# Patient Record
Sex: Female | Born: 1995 | Race: White | Hispanic: No | Marital: Single | Smoking: Never smoker
Health system: Southern US, Community
[De-identification: ages and names within clinical notes are randomized; demographics above are authoritative.]

## PROBLEM LIST (undated history)

## (undated) DIAGNOSIS — F32A Depression, unspecified: Secondary | ICD-10-CM

## (undated) DIAGNOSIS — F329 Major depressive disorder, single episode, unspecified: Secondary | ICD-10-CM

## (undated) HISTORY — DX: Major depressive disorder, single episode, unspecified: F32.9

## (undated) HISTORY — PX: WISDOM TOOTH EXTRACTION: SHX21

## (undated) HISTORY — DX: Depression, unspecified: F32.A

## (undated) HISTORY — PX: ADENOIDECTOMY: SHX5191

---
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
## 2014-01-31 ENCOUNTER — Telehealth: Payer: Self-pay | Admitting: Family Medicine

## 2014-01-31 NOTE — Telephone Encounter (Signed)
Patient needs a copy of her shot record and a copy of her tb test.  She would like to pick it up tomorrow afternoon.  She will sign a release when she comes in.

## 2014-01-31 NOTE — Telephone Encounter (Signed)
Spoke with mom, who informed me that she needs a TB test for a job she is trying to get.   Advised that her last TB test was in 2013 and they are only valid for one year and that she will need a new one.  Appt scheduled for Friday. [place], Maryjo RochesterJessica Dawn

## 2014-02-01 ENCOUNTER — Ambulatory Visit (INDEPENDENT_AMBULATORY_CARE_PROVIDER_SITE_OTHER): Payer: Medicaid Other | Admitting: *Deleted

## 2014-02-01 DIAGNOSIS — Z111 Encounter for screening for respiratory tuberculosis: Secondary | ICD-10-CM

## 2014-02-01 DIAGNOSIS — Z23 Encounter for immunization: Secondary | ICD-10-CM

## 2014-02-04 ENCOUNTER — Ambulatory Visit (INDEPENDENT_AMBULATORY_CARE_PROVIDER_SITE_OTHER): Payer: No Typology Code available for payment source | Admitting: *Deleted

## 2014-02-04 ENCOUNTER — Encounter: Payer: Self-pay | Admitting: *Deleted

## 2014-02-04 DIAGNOSIS — Z111 Encounter for screening for respiratory tuberculosis: Secondary | ICD-10-CM

## 2014-02-04 LAB — TB SKIN TEST
INDURATION: 0 mm
TB Skin Test: NEGATIVE

## 2014-02-11 ENCOUNTER — Other Ambulatory Visit: Payer: Self-pay | Admitting: *Deleted

## 2014-02-11 MED ORDER — NORGESTIMATE-ETH ESTRADIOL 0.25-35 MG-MCG PO TABS: 1.0000 | ORAL_TABLET | Freq: Every day | ORAL | Status: DC

## 2014-03-06 ENCOUNTER — Encounter: Payer: Self-pay | Admitting: Family Medicine

## 2014-03-06 ENCOUNTER — Ambulatory Visit (INDEPENDENT_AMBULATORY_CARE_PROVIDER_SITE_OTHER): Payer: Medicaid Other | Admitting: Family Medicine

## 2014-03-06 VITALS — BP 117/69 | HR 84 | Temp 98.3°F | Ht 65.0 in | Wt 154.0 lb

## 2014-03-06 DIAGNOSIS — Z0000 Encounter for general adult medical examination without abnormal findings: Secondary | ICD-10-CM

## 2014-03-06 DIAGNOSIS — Z309 Encounter for contraceptive management, unspecified: Secondary | ICD-10-CM

## 2014-03-06 MED ORDER — NORGESTIMATE-ETH ESTRADIOL 0.25-35 MG-MCG PO TABS: 1.0000 | ORAL_TABLET | Freq: Every day | ORAL | Status: DC

## 2014-03-06 NOTE — Patient Instructions (Signed)
Sorry for your wait. Glad the birth control is working well for you. See you in a year if needed, otherwise just as needed.

## 2014-03-06 NOTE — Progress Notes (Signed)
Subjective:    Sandra Hodges is a 18 y.o. female who presents for contraception counseling. The patient has no complaints today. The patient is sexually active. She is having monthly periods which are not particularly painful or heavy.   Pertinent past medical history: none, non smoker. History of breakthrough bleeding on nexplanon  Menstrual History: G0P0   Review of Systems Denies nausea/vomiting/fever/chills/fatigue/overall sick feelings. No excessive bleeding. No lightheadedness or dizziness Objective:  BP 117/69  Pulse 84  Temp(Src) 98.3 F (36.8 C) (Oral)  Ht 5\' 5"  (1.651 m)  Wt 154 lb (69.854 kg)  BMI 25.63 kg/m2 Well appearing, no acute distress  Assessment:    18 y.o., continuing OCP (estrogen/progesterone), no contraindications.   Plan:    All questions answered. Discussed healthy lifestyle modifications. pap smear at age 18   Health Maintenance Due  Topic Date Due  . Tetanus/tdap -received for nursing school, will try to figure out when/where and let us know at next visit 01/05/2014

## 2014-03-08 NOTE — Progress Notes (Signed)
Yes I can change this.  What would be the recommendation for this patient for pap smears.  The default HM is female 4318 years and older every 3 years.

## 2014-03-22 ENCOUNTER — Ambulatory Visit (INDEPENDENT_AMBULATORY_CARE_PROVIDER_SITE_OTHER): Payer: Medicaid Other | Admitting: Family Medicine

## 2014-03-22 ENCOUNTER — Encounter: Payer: Self-pay | Admitting: Family Medicine

## 2014-03-22 VITALS — BP 102/71 | HR 102 | Temp 98.3°F | Wt 153.0 lb

## 2014-03-22 DIAGNOSIS — J020 Streptococcal pharyngitis: Secondary | ICD-10-CM

## 2014-03-22 DIAGNOSIS — J029 Acute pharyngitis, unspecified: Secondary | ICD-10-CM

## 2014-03-22 LAB — POCT RAPID STREP A (OFFICE): RAPID STREP A SCREEN: NEGATIVE

## 2014-03-22 MED ORDER — PENICILLIN G BENZATHINE 1200000 UNIT/2ML IM SUSP
1.2000 10*6.[IU] | Freq: Once | INTRAMUSCULAR | Status: AC
  Administered 2014-03-22: 1.2 10*6.[IU] via INTRAMUSCULAR

## 2014-03-22 NOTE — Patient Instructions (Signed)
You have enough indicators that we will treat even if your first test is negative. I will not have them send out the confirmatory testing as it will not change management. Please come back to see us if your symptoms are persistent into next week.   Strep Throat Strep throat is an infection of the throat caused by a bacteria named Streptococcus pyogenes. Your caregiver may call the infection streptococcal "tonsillitis" or "pharyngitis" depending on whether there are signs of inflammation in the tonsils or back of the throat. Strep throat is most common in children aged 5 15 years during the cold months of the year, but it can occur in people of any age during any season. This infection is spread from person to person (contagious) through coughing, sneezing, or other close contact. SYMPTOMS   Fever or chills.  Painful, swollen, red tonsils or throat.  Pain or difficulty when swallowing.  White or yellow spots on the tonsils or throat.  Swollen, tender lymph nodes or "glands" of the neck or under the jaw.  Red rash all over the body (rare). DIAGNOSIS  Many different infections can cause the same symptoms. A test must be done to confirm the diagnosis so the right treatment can be given. A "rapid strep test" can help your caregiver make the diagnosis in a few minutes. If this test is not available, a light swab of the infected area can be used for a throat culture test. If a throat culture test is done, results are usually available in a day or two. TREATMENT  Strep throat is treated with antibiotic medicine. HOME CARE INSTRUCTIONS   Gargle with 1 tsp of salt in 1 cup of warm water, 3 4 times per day or as needed for comfort.  Family members who also have a sore throat or fever should be tested for strep throat and treated with antibiotics if they have the strep infection.  Make sure everyone in your household washes their hands well.  Do not share food, drinking cups, or personal items that  could cause the infection to spread to others.  You may need to eat a soft food diet until your sore throat gets better.  Drink enough water and fluids to keep your urine clear or pale yellow. This will help prevent dehydration.  Get plenty of rest.  Stay home from school, daycare, or work until you have been on antibiotics for 24 hours.  Only take over-the-counter or prescription medicines for pain, discomfort, or fever as directed by your caregiver.  If antibiotics are prescribed, take them as directed. Finish them even if you start to feel better. SEEK MEDICAL CARE IF:   The glands in your neck continue to enlarge.  You develop a rash, cough, or earache.  You cough up green, yellow-brown, or bloody sputum.  You have pain or discomfort not controlled by medicines.  Your problems seem to be getting worse rather than better. SEEK IMMEDIATE MEDICAL CARE IF:   You develop any new symptoms such as vomiting, severe headache, stiff or painful neck, chest pain, shortness of breath, or trouble swallowing.  You develop severe throat pain, drooling, or changes in your voice.  You develop swelling of the neck, or the skin on the neck becomes red and tender.  You have a fever.  You develop signs of dehydration, such as fatigue, dry mouth, and decreased urination.  You become increasingly sleepy, or you cannot wake up completely. Document Released: 10/29/2000 Document Revised: 10/18/2012 Document Reviewed: 12/31/2010  ExitCare Patient Information 2014 [place], [place].  Still try to get us the results from your last: Health Maintenance Due  Topic Date Due  . Tetanus/tdap  01/05/2014

## 2014-03-22 NOTE — Progress Notes (Signed)
  Sandra ConchStephen Hunter, MD Phone: [phone]  Subjective:   Sandra Hodges is a 18 y.o. year old very pleasant female patient who presents with the following:  Sore throat X 3 days. Stable in course. No cough. Enlarged tonsils that had some exudate on them previously. Subjective fevers. Known strep contacts. Has tried tylenol, dayquil and nyquil with only mild relief. Slightly decreased PO ROS- no nausea/vomiting. Mild sinus drainage. No headaches or blurry vision. No rash   Past Medical History- non smoker Social history-just finished semester at [employer], working towards being a Engineer, civil (consulting)nurse  Medications- reviewed and updated Current Outpatient Prescriptions  Medication Sig Dispense Refill  . norgestimate-ethinyl estradiol (ORTHO-CYCLEN,SPRINTEC,PREVIFEM) 0.25-35 MG-MCG tablet Take 1 tablet by mouth daily.  1 Package  11   No current facility-administered medications for this visit.    Objective: BP 102/71  Pulse 102  Temp(Src) 98.3 F (36.8 C) (Oral)  Wt 153 lb (69.4 kg)  LMP 03/11/2014 Gen: NAD, resting comfortably on table HEENT: tender lymphadenopathy noted, enlarged erythematous tonsils without exudate, pharynx with moderate erythema, no obvious rhinorrhea and nares normal, PERRLA CV: RRR no murmurs rubs or gallops Lungs: CTAB no crackles, wheeze, rhonchi Skin: warm, dry, no rash  Assessment/Plan:  Strep throat 4/5 Centor Criteria (no cough, enlarged tonsils, tender lymph nodes, fever) with known positive strep contact. Rapid strep is negative but will empirically treat with Bicillin so did not send confirmatory testing. F/u if no improvement next week.   Orders Placed This Encounter  Procedures  . Rapid Strep A -negative   Meds ordered this encounter  Medications  . penicillin g benzathine (BICILLIN LA) 1200000 UNIT/2ML injection 1.2 Million Units    Sig:     Order Specific Question:  Antibiotic Indication:    Answer:  Pharyngitis

## 2014-08-16 ENCOUNTER — Encounter: Payer: Self-pay | Admitting: Family Medicine

## 2014-08-16 ENCOUNTER — Ambulatory Visit (INDEPENDENT_AMBULATORY_CARE_PROVIDER_SITE_OTHER): Payer: BC Managed Care – PPO | Admitting: Family Medicine

## 2014-08-16 VITALS — BP 119/86 | HR 94 | Temp 98.6°F | Wt 155.0 lb

## 2014-08-16 DIAGNOSIS — Z23 Encounter for immunization: Secondary | ICD-10-CM | POA: Diagnosis not present

## 2014-08-16 DIAGNOSIS — J029 Acute pharyngitis, unspecified: Secondary | ICD-10-CM

## 2014-08-16 LAB — POCT MONO (EPSTEIN BARR VIRUS): Mono, POC: NEGATIVE

## 2014-08-16 LAB — POCT RAPID STREP A (OFFICE): Rapid Strep A Screen: NEGATIVE

## 2014-08-16 NOTE — Patient Instructions (Signed)
You have viral infection that will resolve on its own over time.  Typically, symptoms can last for up to 10 days (or more) but should be continually improving after the 5th day.  If you exerpience worsening after the 7th day please call us back.  Please continue to drink plenty of fluids and remember you and everybody in your household need to wash their hands frequently!  Honey has been shown to help with cough and soothe the throat. You can try mixing  a teaspoon of honey in warm water or caffeine free herbal tea before bedtime. Gargling with salt water can also help.  We don't know why, but chicken soup also helps, try it!  I will call in antibiotics if your throat culture is positive

## 2014-08-17 LAB — STREP A DNA PROBE: GASP: NEGATIVE

## 2014-08-27 ENCOUNTER — Ambulatory Visit (INDEPENDENT_AMBULATORY_CARE_PROVIDER_SITE_OTHER): Payer: BC Managed Care – PPO | Admitting: Family Medicine

## 2014-08-27 ENCOUNTER — Encounter: Payer: Self-pay | Admitting: Family Medicine

## 2014-08-27 VITALS — BP 103/72 | HR 103 | Temp 98.5°F | Wt 155.0 lb

## 2014-08-27 DIAGNOSIS — J019 Acute sinusitis, unspecified: Secondary | ICD-10-CM | POA: Insufficient documentation

## 2014-08-27 DIAGNOSIS — J029 Acute pharyngitis, unspecified: Secondary | ICD-10-CM | POA: Diagnosis not present

## 2014-08-27 LAB — POCT RAPID STREP A (OFFICE): Rapid Strep A Screen: NEGATIVE

## 2014-08-27 MED ORDER — AMOXICILLIN-POT CLAVULANATE 875-125 MG PO TABS: 1.0000 | ORAL_TABLET | Freq: Two times a day (BID) | ORAL | Status: DC

## 2014-08-27 NOTE — Progress Notes (Signed)
   Subjective:    Patient ID: Sandra Hodges, female    DOB: 1996-06-09, 18 y.o.   MRN: [id number]  Patient presents for a same day appointment.  HPI  SINUS CONGESTION / SORE THROAT: - [facility] for same complaint, suspected viral pharyngitis, testing with GSA swab negative, and Monospot negative - Reports feeling significant sinus congestion and sinus pressure, feels like throat is swollen and sore with swallowing, additionally sinus pressure with fluid in both ears. Overall symptoms have been going on for about 2.5 weeks with some improvement, but never completely resolved, and then now seems to be worsening over past 1 week. Last visit 08/16/14 with suspected viral pharyngitis did not receive any antibiotics. Also history of recent strep throat (x 2 within the past year, 01/2014 and 03/2014), resolved after PCN injection at clinic - Additionally states boyfriend with similar symptoms (+sick contact), recently tested positive for strep and treated for sinus infection - Taking Mucinex, DayQuil / NyQuil (not regularly), occasional without significant relief - Admits to feeling "hot flashes and cold chills with some sweating", HAs, sinus pressure, nasal congestion - Denies significant cough (improved with Mucinex), abdominal pain, nausea / vomiting, ear pain  HM: - Received influenza vaccine 10/2  I have reviewed and updated the following as appropriate: allergies and current medications  Social Hx: - Never smoker  Review of Systems  See above HPI    Objective:   Physical Exam  BP 103/72  Pulse 103  Temp(Src) 98.5 F (36.9 C) (Oral)  Wt 155 lb (70.308 kg)  LMP 08/09/2014  Gen - ill-appearing but non-toxic, comfortable and cooperative, NAD HEENT - NCAT without significant sinus tenderness, PERRL, EOMI, mildly edematous turbinates with thick congestion, oropharynx bilateral tonsillar erythema and edema with some exudates appears symmetrical, TM's bilateral with  mild clear effusion, no erythema or significant bulging, MMM Neck - supple, positive tender anterior LAD (R>L) Heart - mildly tachycardic, regular rhythm, no murmurs Lungs - CTAB, no wheezing, crackles, or rhonchi. Normal work of breathing. Skin - warm, dry, no rashes Neuro - awake, alert     Assessment & Plan:   See specific A&P problem list for details.

## 2014-08-27 NOTE — Assessment & Plan Note (Signed)
Clinically consistent with Strep Pharyngitis despite negative rapid strep (08/27/14) with Centor score 3-4 (+tonsillar exudates, anterior cervical tender LAD, subjective fever, and absence of cough), also concern with prior strep pharyngitis infection, and current known contact with strep positive - Last OV 08/16/14 with GSA DNA probe negative and Monospot negative (considered mono, less likely with negative screen)  Plan: 1. Empiric coverage for strep pharyngitis with Augmentin for sinusitis 2. F/u if no improvement

## 2014-08-27 NOTE — Patient Instructions (Signed)
Dear Sandra Hodges, Thank you for coming in to clinic today  1. I think that you have a Sinus Infection and Strep throat - we will treat with Augmentin take 1 tablet twice daily (every 12 hours) for 10 days (continue to take even if you feel better) 2. Rapid strep negative today, and mono negative at last visit  Please schedule a follow-up appointment with Dr. Jimmey RalphParker in 7 to 10 days if symptoms not resolving or sooner if worsening.  If you have any other questions or concerns, please feel free to call the clinic to contact me. You may also schedule an earlier appointment if necessary.  However, if your symptoms get significantly worse, please go to the Emergency Department to seek immediate medical attention.  Saralyn PilarAlexander Karamalegos, DO [facility]   Sinusitis Sinusitis is redness, soreness, and inflammation of the paranasal sinuses. Paranasal sinuses are air pockets within the bones of your face (beneath the eyes, the middle of the forehead, or above the eyes). In healthy paranasal sinuses, mucus is able to drain out, and air is able to circulate through them by way of your nose. However, when your paranasal sinuses are inflamed, mucus and air can become trapped. This can allow bacteria and other germs to grow and cause infection. Sinusitis can develop quickly and last only a short time (acute) or continue over a long period (chronic). Sinusitis that lasts for more than 12 weeks is considered chronic.  CAUSES  Causes of sinusitis include:  Allergies.  Structural abnormalities, such as displacement of the cartilage that separates your nostrils (deviated septum), which can decrease the air flow through your nose and sinuses and affect sinus drainage.  Functional abnormalities, such as when the small hairs (cilia) that line your sinuses and help remove mucus do not work properly or are not present. SIGNS AND SYMPTOMS  Symptoms of acute and chronic sinusitis are the same. The primary  symptoms are pain and pressure around the affected sinuses. Other symptoms include:  Upper toothache.  Earache.  Headache.  Bad breath.  Decreased sense of smell and taste.  A cough, which worsens when you are lying flat.  Fatigue.  Fever.  Thick drainage from your nose, which often is green and may contain pus (purulent).  Swelling and warmth over the affected sinuses. DIAGNOSIS  Your health care provider will perform a physical exam. During the exam, your health care provider may:  Look in your nose for signs of abnormal growths in your nostrils (nasal polyps).  Tap over the affected sinus to check for signs of infection.  View the inside of your sinuses (endoscopy) using an imaging device that has a light attached (endoscope). If your health care provider suspects that you have chronic sinusitis, one or more of the following tests may be recommended:  Allergy tests.  Nasal culture. A sample of mucus is taken from your nose, sent to a lab, and screened for bacteria.  Nasal cytology. A sample of mucus is taken from your nose and examined by your health care provider to determine if your sinusitis is related to an allergy. TREATMENT  Most cases of acute sinusitis are related to a viral infection and will resolve on their own within 10 days. Sometimes medicines are prescribed to help relieve symptoms (pain medicine, decongestants, nasal steroid sprays, or saline sprays).  However, for sinusitis related to a bacterial infection, your health care provider will prescribe antibiotic medicines. These are medicines that will help kill the bacteria causing the  infection.  Rarely, sinusitis is caused by a fungal infection. In theses cases, your health care provider will prescribe antifungal medicine. For some cases of chronic sinusitis, surgery is needed. Generally, these are cases in which sinusitis recurs more than 3 times per year, despite other treatments. HOME CARE INSTRUCTIONS     Drink plenty of water. Water helps thin the mucus so your sinuses can drain more easily.  Use a humidifier.  Inhale steam 3 to 4 times a day (for example, sit in the bathroom with the shower running).  Apply a warm, moist washcloth to your face 3 to 4 times a day, or as directed by your health care provider.  Use saline nasal sprays to help moisten and clean your sinuses.  Take medicines only as directed by your health care provider.  If you were prescribed either an antibiotic or antifungal medicine, finish it all even if you start to feel better. SEEK IMMEDIATE MEDICAL CARE IF:  You have increasing pain or severe headaches.  You have nausea, vomiting, or drowsiness.  You have swelling around your face.  You have vision problems.  You have a stiff neck.  You have difficulty breathing. MAKE SURE YOU:   Understand these instructions.  Will watch your condition.  Will get help right away if you are not doing well or get worse. Document Released: 11/01/2005 Document Revised: 03/18/2014 Document Reviewed: 11/16/2011 [facility] Patient Information 2015 [place], [place]. This information is not intended to replace advice given to you by your health care provider. Make sure you discuss any questions you have with your health care provider.

## 2014-08-27 NOTE — Assessment & Plan Note (Signed)
Clinically consistent with acute sinusitis, suspect bacterial etiology in setting of worsening URI symptoms, persistent >2 weeks, likely started with viral URI/pharyngitis, known sick contacts. - Afebrile currently, ill but well-appearing, no focal sinus findings or evidence of AoM - Additionally with suspected strep pharyngitis  Plan: 1. Augmentin 875-125 PO BID x 10 days, empiric treatment for sinusitis and coverage for possible strep throat 2. Continue conservative therapy PRN 3. RTC 7-10 days if no improvement / worsening

## 2014-09-01 NOTE — Assessment & Plan Note (Signed)
Negative rapid strep, confirmatory test sent, treated multiple times despite negative tests, likely viral - defer abx at this time, symptoms management discussed - RTC if not improved in 1 week

## 2014-09-01 NOTE — Progress Notes (Signed)
   Subjective:    Patient ID: Sandra Hodges, female    DOB: 16-Oct-1996, 18 y.o.   MRN: [id number]  Sore Throat    Pt presents for sore throat. No cough, congestion. No voice changes, subjective fevers. Headaches and muscle aches. Reports boyfriend has strep throat and she has had another strep infection last month. On record review she tested negative at that time but got empiric antibiotics.    Review of Systems See HPI    Objective:   Physical Exam  Nursing note and vitals reviewed. Constitutional: She is oriented to person, place, and time. She appears well-developed and well-nourished. No distress.  HENT:  Head: Normocephalic and atraumatic.  Mouth/Throat: Uvula is midline and mucous membranes are normal. No uvula swelling. Posterior oropharyngeal erythema present. No oropharyngeal exudate, posterior oropharyngeal edema or tonsillar abscesses.  Eyes: Conjunctivae are normal. Right eye exhibits no discharge. Left eye exhibits no discharge. No scleral icterus.  Cardiovascular: Normal rate.   Pulmonary/Chest: Effort normal.  Abdominal: She exhibits no distension.  Lymphadenopathy:    She has cervical adenopathy.  Neurological: She is alert and oriented to person, place, and time.  Skin: Skin is warm and dry. No rash noted. She is not diaphoretic.  Psychiatric: She has a normal mood and affect. Her behavior is normal.          Assessment & Plan:

## 2014-11-29 ENCOUNTER — Other Ambulatory Visit (HOSPITAL_COMMUNITY)
Admission: RE | Admit: 2014-11-29 | Discharge: 2014-11-29 | Disposition: A | Discharge status: Home / Self Care | Payer: Medicaid Other | Source: Ambulatory Visit | Attending: Family Medicine | Admitting: Family Medicine

## 2014-11-29 ENCOUNTER — Encounter: Payer: Self-pay | Admitting: Family Medicine

## 2014-11-29 ENCOUNTER — Ambulatory Visit (INDEPENDENT_AMBULATORY_CARE_PROVIDER_SITE_OTHER): Payer: Medicaid Other | Admitting: Family Medicine

## 2014-11-29 VITALS — BP 112/76 | HR 87 | Temp 98.0°F | Wt 155.0 lb

## 2014-11-29 DIAGNOSIS — B3731 Acute candidiasis of vulva and vagina: Secondary | ICD-10-CM

## 2014-11-29 DIAGNOSIS — Z113 Encounter for screening for infections with a predominantly sexual mode of transmission: Secondary | ICD-10-CM | POA: Insufficient documentation

## 2014-11-29 DIAGNOSIS — Z202 Contact with and (suspected) exposure to infections with a predominantly sexual mode of transmission: Secondary | ICD-10-CM

## 2014-11-29 DIAGNOSIS — B373 Candidiasis of vulva and vagina: Secondary | ICD-10-CM

## 2014-11-29 LAB — POCT WET PREP (WET MOUNT)
Clue Cells Wet Prep Whiff POC: POSITIVE
WBC, Wet Prep HPF POC: >20

## 2014-11-29 LAB — POCT GLYCOSYLATED HEMOGLOBIN (HGB A1C): HEMOGLOBIN A1C: 5

## 2014-11-29 MED ORDER — FLUCONAZOLE 150 MG PO TABS: 150.0000 mg | ORAL_TABLET | ORAL | Status: DC

## 2014-11-29 NOTE — Patient Instructions (Addendum)
sdfasdf  asd Vaginitis Vaginitis is an inflammation of the vagina. It is most often caused by a change in the normal balance of the bacteria and yeast that live in the vagina. This change in balance causes an overgrowth of certain bacteria or yeast, which causes the inflammation. There are different types of vaginitis, but the most common types are:  Bacterial vaginosis.  Yeast infection (candidiasis).  Trichomoniasis vaginitis. This is a sexually transmitted infection (STI).  Viral vaginitis.  Atropic vaginitis.  Allergic vaginitis. CAUSES  The cause depends on the type of vaginitis. Vaginitis can be caused by:  Bacteria (bacterial vaginosis).  Yeast (yeast infection).  A parasite (trichomoniasis vaginitis)  A virus (viral vaginitis).  Low hormone levels (atrophic vaginitis). Low hormone levels can occur during pregnancy, breastfeeding, or after menopause.  Irritants, such as bubble baths, scented tampons, and feminine sprays (allergic vaginitis). Other factors can change the normal balance of the yeast and bacteria that live in the vagina. These include:  Antibiotic medicines.  Poor hygiene.  Diaphragms, vaginal sponges, spermicides, birth control pills, and intrauterine devices (IUD).  Sexual intercourse.  Infection.  Uncontrolled diabetes.  A weakened immune system. SYMPTOMS  Symptoms can vary depending on the cause of the vaginitis. Common symptoms include:  Abnormal vaginal discharge.  The discharge is white, gray, or yellow with bacterial vaginosis.  The discharge is thick, white, and cheesy with a yeast infection.  The discharge is frothy and yellow or greenish with trichomoniasis.  A bad vaginal odor.  The odor is fishy with bacterial vaginosis.  Vaginal itching, pain, or swelling.  Painful intercourse.  Pain or burning when urinating. Sometimes, there are no symptoms. TREATMENT  Treatment will vary depending on the type of infection.    Bacterial vaginosis and trichomoniasis are often treated with antibiotic creams or pills.  Yeast infections are often treated with antifungal medicines, such as vaginal creams or suppositories.  Viral vaginitis has no cure, but symptoms can be treated with medicines that relieve discomfort. Your sexual partner should be treated as well.  Atrophic vaginitis may be treated with an estrogen cream, pill, suppository, or vaginal ring. If vaginal dryness occurs, lubricants and moisturizing creams may help. You may be told to avoid scented soaps, sprays, or douches.  Allergic vaginitis treatment involves quitting the use of the product that is causing the problem. Vaginal creams can be used to treat the symptoms. HOME CARE INSTRUCTIONS   Take all medicines as directed by your caregiver.  Keep your genital area clean and dry. Avoid soap and only rinse the area with water.  Avoid douching. It can remove the healthy bacteria in the vagina.  Do not use tampons or have sexual intercourse until your vaginitis has been treated. Use sanitary pads while you have vaginitis.  Wipe from front to back. This avoids the spread of bacteria from the rectum to the vagina.  Let air reach your genital area.  Wear cotton underwear to decrease moisture buildup.  Avoid wearing underwear while you sleep until your vaginitis is gone.  Avoid tight pants and underwear or nylons without a cotton panel.  Take off wet clothing (especially bathing suits) as soon as possible.  Use mild, non-scented products. Avoid using irritants, such as:  Scented feminine sprays.  Fabric softeners.  Scented detergents.  Scented tampons.  Scented soaps or bubble baths.  Practice safe sex and use condoms. Condoms may prevent the spread of trichomoniasis and viral vaginitis. SEEK MEDICAL CARE IF:   You have abdominal  pain.  You have a fever or persistent symptoms for more than 2-3 days.  You have a fever and your  symptoms suddenly get worse. Document Released: 08/29/2007 Document Revised: 07/26/2012 Document Reviewed: 04/13/2012 [facility] Patient Information 2015 [place], [place]. This information is not intended to replace advice given to you by your health care provider. Make sure you discuss any questions you have with your health care provider.      White cotton underwear Daily probiotic Diflucan weekly x 1 week No douching EVER

## 2014-11-29 NOTE — Progress Notes (Signed)
   Subjective:    Patient ID: Sandra Hodges, female    DOB: [birth year], 19 y.o.   MRN: [id number]  HPI  Here for recurrent BV and recurrent yeast infection.  Reports getting yeast infections monthly.  Today reporting yeast infection.  Contraception: OCPs  Review of Systems  Constitutional: Negative for chills and diaphoresis.  Respiratory: Negative for cough and shortness of breath.   Cardiovascular: Negative for leg swelling.  Gastrointestinal: Negative for abdominal pain, diarrhea, constipation and anal bleeding.  Endocrine: Negative for cold intolerance and heat intolerance.  Genitourinary: Negative for dysuria, urgency, decreased urine volume and difficulty urinating.       Vaginal itching   Skin: Positive for rash. Negative for wound.  Neurological: Negative for headaches.       Objective:   Physical Exam  Constitutional: She appears well-developed and well-nourished. No distress.  HENT:  Mouth/Throat: Mucous membranes are moist. Pharynx is normal.  Eyes: Conjunctivae and EOM are normal.  Neck: No adenopathy.  Cardiovascular: Normal rate and S2 normal.   Pulmonary/Chest: Effort normal.  Abdominal: She exhibits no distension. There is no tenderness.  Genitourinary: Vaginal discharge (small amount of white thick discharge) found.  Mucosa erythematous, no lesions  Musculoskeletal: Normal range of motion.  Neurological: She is alert. No cranial nerve deficit. Coordination normal.  Skin: Skin is warm. No rash noted. She is not diaphoretic. No pallor.          Assessment & Plan:  Sandra Hodges was seen today for vaginal discharge.  Diagnoses and associated orders for this visit:  Recurrent candidiasis of vagina - Wet prep, genital - GC/Chlamydia Amp Probe, Genital - RPR - HIV antibody (with reflex) - Hemoglobin A1c - fluconazole (DIFLUCAN) 150 MG tablet; Take 1 tablet (150 mg total) by mouth once a week x6 months, Rx #30, no RF - cotton white underwear - no  douching - probiotic  STD exposure - Wet prep, genital - GC/Chlamydia Amp Probe, Genital - RPR - HIV antibody (with reflex)  Other Orders

## 2014-11-29 NOTE — Addendum Note (Signed)
Addended by: Swazil[place]ORDAN, Taryll Reichenberger on: 11/29/2014 05:15 PM   Modules accepted: Orders

## 2014-11-30 LAB — HIV ANTIBODY (ROUTINE TESTING W REFLEX): HIV 1&2 Ab, 4th Generation: NONREACTIVE

## 2014-11-30 LAB — RPR

## 2014-12-02 LAB — GC/CHLAMYDIA PROBE AMP (~~LOC~~) NOT AT ARMC
CHLAMYDIA, DNA PROBE: NEGATIVE
NEISSERIA GONORRHEA: NEGATIVE

## 2014-12-26 ENCOUNTER — Ambulatory Visit (INDEPENDENT_AMBULATORY_CARE_PROVIDER_SITE_OTHER): Payer: BLUE CROSS/BLUE SHIELD | Admitting: Family Medicine

## 2014-12-26 VITALS — BP 115/68 | HR 102 | Temp 98.9°F | Wt 159.0 lb

## 2014-12-26 DIAGNOSIS — N760 Acute vaginitis: Secondary | ICD-10-CM | POA: Diagnosis not present

## 2014-12-26 MED ORDER — METRONIDAZOLE 500 MG PO TABS: 500.0000 mg | ORAL_TABLET | Freq: Two times a day (BID) | ORAL | Status: DC

## 2014-12-26 MED ORDER — FLUCONAZOLE 200 MG PO TABS: ORAL_TABLET | ORAL | Status: DC

## 2014-12-26 MED ORDER — METRONIDAZOLE 0.75 % VA GEL: 1.0000 | VAGINAL | Status: DC

## 2014-12-26 MED ORDER — AZITHROMYCIN 250 MG PO TABS
1000.0000 mg | ORAL_TABLET | Freq: Once | ORAL | Status: AC
  Administered 2014-12-26: 1000 mg via ORAL

## 2014-12-26 NOTE — Progress Notes (Signed)
   Subjective:    Patient ID: Sandra Hodges, female    DOB: [birth year], 19 y.o.   MRN: [id number]  HPI Current vaginal discharge. Reports she has been seen recently and had full STD screening and was positive for bacterial vaginosis but did not receive treatment. She was also positive for yeast at that time and she did receive treatment for yeast.  Relates she's had thick vaginal discharge every month for most days the month ever since she started having periods. In the last couple of years has been very disturbing to her and she's had treatment for BV multiple times that seems to keep coming back. She does not use condoms. Currently has one sexual partner. Her menses are regular on oral contraceptives which she uses regularly.   Review of Systems No unusual weight change, no increased thirst, no increased frequency of urination. No pelvic pain. Vaginal discharge as above. No rash, fever, sweats, chills. No unusual bruising.    Objective:   Physical Exam  Vital signs are reviewed GEN.: Well-developed female no acute distress GU: Externally normal female genitalia without any sign of lesions. There is scant amount of sick clear vaginal discharge. CHART REVIEW: Has positive wet prep for BV on generic 15th. I see no sign of treatment for that. Negative HIV, negative RPR, negative GC Chlamydia, hemoglobin A1c less than 6.       Assessment & Plan:

## 2014-12-26 NOTE — Assessment & Plan Note (Addendum)
See patient instructions for the regimen we have decided upon. I will treat her wants with azithromycin 1 g in case she has infection with mycoplasma genitalum. We'll have her do a full treatment for BV, and try her on once a week prophylactic vaginal metronidazole. Since I gave her out of I asked today I'll treat her tonight and then in 2 weeks with Diflucan. She's urged to use condoms particular in the setting of use of antibiotics today. I'll see her back in 3 months. She'll continue her oral contraceptives. Greater than 50% of our 45 minute office visit was spent in counseling and education regarding these issues.

## 2014-12-26 NOTE — Patient Instructions (Addendum)
We gave you the single dose of azithromycin in clinic. You will not need any more of that. I want you to take the tablet of diflucan one tonight and one in 2 weeks (by mouth) Tomorrow I want you to start the metronidazole pills--one twice a day for a week. Take them ALL. Do not drink alcohol while taking these. For long term prophylaxis, I want you to start using an applicator full of the metronidazole vaginal gel once a week starting the week AFTER you finish the metrinidazole tablets. Do this once a week EVERY week until I see you back in 3 months. Continue your birth control pills. Use condoms especially this month  As I gave you antibiotics and they can interfere with your birth control pills.

## 2015-01-03 ENCOUNTER — Ambulatory Visit: Payer: Medicaid Other | Admitting: Family Medicine

## 2015-01-21 ENCOUNTER — Other Ambulatory Visit: Payer: Self-pay | Admitting: Family Medicine

## 2015-01-21 MED ORDER — NORGESTIMATE-ETH ESTRADIOL 0.25-35 MG-MCG PO TABS: 1.0000 | ORAL_TABLET | Freq: Every day | ORAL | Status: DC

## 2015-01-21 NOTE — Telephone Encounter (Signed)
Needs birth control refilled [facility] on [address] Suppose to start the blue pills tomorrow

## 2015-02-14 ENCOUNTER — Encounter: Payer: Self-pay | Admitting: *Deleted

## 2015-02-14 ENCOUNTER — Other Ambulatory Visit: Payer: Self-pay | Admitting: Family Medicine

## 2015-02-14 MED ORDER — METRONIDAZOLE 0.75 % VA GEL: 1.0000 | VAGINAL | Status: DC

## 2015-02-14 NOTE — Progress Notes (Unsigned)
[facility]. Please resend for brand name Metrogel.  Please state medically necessary for billing.    Clovis PuMartin, Masin Shatto L, RN

## 2015-03-26 ENCOUNTER — Encounter: Payer: Self-pay | Admitting: Family Medicine

## 2015-03-26 ENCOUNTER — Ambulatory Visit (INDEPENDENT_AMBULATORY_CARE_PROVIDER_SITE_OTHER): Payer: BLUE CROSS/BLUE SHIELD | Admitting: Family Medicine

## 2015-03-26 VITALS — BP 115/81 | HR 86 | Temp 98.0°F | Ht 65.0 in | Wt 156.1 lb

## 2015-03-26 DIAGNOSIS — N760 Acute vaginitis: Secondary | ICD-10-CM

## 2015-03-26 MED ORDER — METRONIDAZOLE 500 MG PO TABS: 500.0000 mg | ORAL_TABLET | Freq: Two times a day (BID) | ORAL | Status: DC

## 2015-03-26 MED ORDER — METRONIDAZOLE 0.75 % VA GEL: 1.0000 | VAGINAL | Status: DC

## 2015-03-26 MED ORDER — FLUCONAZOLE 200 MG PO TABS: ORAL_TABLET | ORAL | Status: DC

## 2015-03-26 NOTE — Assessment & Plan Note (Signed)
I refilled all of her medicines in case she has another episode. We discussed how to go through the treatment plan again. She has any questions she'll call me. She has any unusual or worsening of symptoms I'll be happy see her back at any time. She's quite happy with the results. Hopefully she'll do well. She'll have some medicine on hand in case she has recurrence.

## 2015-03-26 NOTE — Progress Notes (Signed)
   Subjective:    Patient ID: Sandra Hodges, female    DOB: Jul 31, 1996, 19 y.o.   MRN: [id number]  HPI  Follow-up vaginal irritation. At last office visit we determined she had a vaginal yeast infection coinciding with recurrent bacterial vaginosis. We treated her acutely for above and then I had her use MetroGel once weekly. She did that for 4 weeks. At that point she started having some vaginal irritation with a little bit of bleeding so she stopped the MetroGel. Since then she says she has had great results, having only an normal amount of discharge. Says this is the best it's been in several years. She's very happy. She continues on her OCPs. She wonders if this will come back.  Review of Systems No abdominal pain, fever, sweats, chills. No unusual vaginal bleeding except per history of present illness.    Objective:   Physical Exam Well-developed female no acute distress       Assessment & Plan:

## 2015-04-04 ENCOUNTER — Ambulatory Visit (INDEPENDENT_AMBULATORY_CARE_PROVIDER_SITE_OTHER): Payer: BLUE CROSS/BLUE SHIELD | Admitting: Family Medicine

## 2015-04-04 ENCOUNTER — Encounter: Payer: Self-pay | Admitting: Family Medicine

## 2015-04-04 VITALS — BP 131/82 | HR 85 | Temp 98.2°F | Ht 65.0 in | Wt 158.0 lb

## 2015-04-04 DIAGNOSIS — Z111 Encounter for screening for respiratory tuberculosis: Secondary | ICD-10-CM | POA: Diagnosis not present

## 2015-04-04 DIAGNOSIS — Z23 Encounter for immunization: Secondary | ICD-10-CM | POA: Diagnosis not present

## 2015-04-04 NOTE — Progress Notes (Signed)
  Subjective:     Sandra Hodges is a 19 y.o. female and is here for a comprehensive physical exam. The patient reports no problems.  History   Social History  . Marital Status: Single    Spouse Name: N/A  . Number of Children: N/A  . Years of Education: N/A   Occupational History  . Not on file.   Social History Main Topics  . Smoking status: Never Smoker   . Smokeless tobacco: Not on file  . Alcohol Use: No  . Drug Use: No  . Sexual Activity: Not on file   Other Topics Concern  . Not on file   Social History Narrative   Health Maintenance  Topic Date Due  . Janet BerlinETANUS/TDAP  01/05/2014  . PAP SMEAR  01/06/2016 (Originally 01/05/2013)  . INFLUENZA VACCINE  06/16/2015  . HIV Screening  Completed    The following portions of the patient's history were reviewed and updated as appropriate: allergies, current medications, past family history, past medical history, past social history, past surgical history and problem list.  Review of Systems A comprehensive review of systems was negative.   Objective:    BP 131/82 mmHg  Pulse 85  Temp(Src) 98.2 F (36.8 C) (Oral)  Ht 5\' 5"  (1.651 m)  Wt 158 lb (71.668 kg)  BMI 26.29 kg/m2  LMP 04/04/2015 General appearance: alert, cooperative, appears stated age and no distress Head: Normocephalic, without obvious abnormality, atraumatic Eyes: conjunctivae/corneas clear. PERRL, EOM's intact. Fundi benign. Ears: normal TM's and external ear canals both ears Nose: Nares normal. Septum midline. Mucosa normal. No drainage or sinus tenderness. Throat: lips, mucosa, and tongue normal; teeth and gums normal Neck: supple, symmetrical, trachea midline and thyroid not enlarged, symmetric, no tenderness/mass/nodules Back: negative, symmetric, no curvature. ROM normal. No CVA tenderness. Lungs: clear to auscultation bilaterally Heart: regular rate and rhythm, S1, S2 normal, no murmur, click, rub or gallop Abdomen: soft, non-tender; bowel  sounds normal; no masses,  no organomegaly Extremities: extremities normal, atraumatic, no cyanosis or edema Pulses: 2+ and symmetric Skin: Skin color, texture, turgor normal. No rashes or lesions Neurologic: Alert and oriented X 3, normal strength and tone. Normal symmetric reflexes. Normal coordination and gait    Assessment:    Healthy female exam.       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 Advised patient that she is due for paptest, but deferred for next visit. Will also administer hepatitis A and HPV vaccines today.   Follow up within the next month for pap test, then 1 year afterwards for annual exam.    See After Visit Summary for Counseling Recommendations

## 2015-04-04 NOTE — Patient Instructions (Signed)
Thank you for coming to the clinic today. It was nice seeing you.  I am glad everything is going well. We will give you your hepatitis A and HPV shots today. You are also due for a pap smear. Please schedule an appointment whenever is convenient for your within the next few months to have this done.  Health Maintenance - 20-20 Years Old SCHOOL PERFORMANCE After high school, you may attend college or technical or vocational school, enroll in the [employer], or enter the workforce. PHYSICAL, SOCIAL, AND EMOTIONAL DEVELOPMENT  One hour of regular physical activity daily is recommended. Continue to participate in sports.  Develop your own interests and consider community service or volunteerism.  Make decisions about college and work plans.  Throughout these years, you should assume responsibility for your own health care. Increasing independence is important for you.  You may be exploring your sexual identity. Understand that you should never be in a situation that makes you feel uncomfortable, and tell your partner if you do not want to engage in sexual activity.  Body image may become important to you. Be mindful that eating disorders can develop at this time. Talk to your parents or other caregivers if you have concerns about body image, weight gain, or losing weight.  You may notice mood disturbances, depression, anxiety, attention problems, or trouble with alcohol. Talk to your health care provider if you have concerns about mental illness.  Set limits for yourself and talk with your parents or other caregivers about independent decision making.  Handle conflict without physical violence.  Avoid loud noises which may impair hearing.  Limit television and computer time to 2 hours each day. Individuals who engage in excessive inactivity are more likely to become overweight. RECOMMENDED IMMUNIZATIONS  Influenza vaccine.  All adults should be immunized every year.  All adults,  including pregnant women and people with hives-only allergy to eggs, can receive the inactivated influenza (IIV) vaccine.  Adults aged 18-49 years can receive the recombinant influenza (RIV) vaccine. The RIV vaccine does not contain any egg protein.  Tetanus, diphtheria, and acellular pertussis (Td, Tdap) vaccine.  Pregnant women should receive 1 dose of Tdap vaccine during each pregnancy. The dose should be obtained regardless of the length of time since the last dose. Immunization is preferred during the 27th to 36th week of gestation.  An adult who has not previously received Tdap or who does not know his or her vaccine status should receive 1 dose of Tdap. This initial dose should be followed by tetanus and diphtheria toxoids (Td) booster doses every 10 years.  Adults with an unknown or incomplete history of completing a 3-dose immunization series with Td-containing vaccines should begin or complete a primary immunization series including a Tdap dose.  Adults should receive a Td booster every 10 years.  Varicella vaccine.  An adult without evidence of immunity to varicella should receive 2 doses or a second dose if he or she has previously received 1 dose.  Pregnant females who do not have evidence of immunity should receive the first dose after pregnancy. This first dose should be obtained before leaving the health care facility. The second dose should be obtained 4-8 weeks after the first dose.  Human papillomavirus (HPV) vaccine.  Females aged 13-26 years who have not received the vaccine previously should obtain the 3-dose series.  The vaccine is not recommended for pregnant females. However, pregnancy testing is not needed before receiving a dose. If a female is found to  be pregnant after receiving a dose, no treatment is needed. In that case, the remaining doses should be delayed until after the pregnancy.  Males aged 36-21 years who have not received the vaccine previously should  receive the 3-dose series. Males aged 22-26 years may be immunized.  Immunization is recommended through the age of 9 years for any female who has sex with males and did not get any or all doses earlier.  Immunization is recommended for any person with an immunocompromised condition through the age of 32 years if he or she did not get any or all doses earlier.  During the 3-dose series, the second dose should be obtained 4-8 weeks after the first dose. The third dose should be obtained 24 weeks after the first dose and 16 weeks after the second dose.  Measles, mumps, and rubella (MMR) vaccine.  Adults born in [birth year] or later should have 1 or more doses of MMR vaccine unless there is a contraindication to the vaccine or there is laboratory evidence of immunity to each of the three diseases.  A routine second dose of MMR vaccine should be obtained at least 28 days after the first dose for students attending postsecondary schools, health care workers, and international travelers.  For females of childbearing age, rubella immunity should be determined. If there is no evidence of immunity, females who are not pregnant should be vaccinated. If there is no evidence of immunity, females who are pregnant should delay immunization until after pregnancy.  Pneumococcal 13-valent conjugate (PCV13) vaccine.  When indicated, a person who is uncertain of his or her immunization history and has no record of immunization should receive the PCV13 vaccine.  An adult aged 28 years or older who has certain medical conditions and has not been previously immunized should receive 1 dose of PCV13 vaccine. This PCV13 should be followed with a dose of pneumococcal polysaccharide (PPSV23) vaccine. The PPSV23 vaccine dose should be obtained at least 8 weeks after the dose of PCV13 vaccine.  An adult aged 54 years or older who has certain medical conditions and previously received 1 or more doses of PPSV23 vaccine should  receive 1 dose of PCV13. The PCV13 vaccine dose should be obtained 1 or more years after the last PPSV23 vaccine dose.  Pneumococcal polysaccharide (PPSV23) vaccine.  When PCV13 is also indicated, PCV13 should be obtained first.  An adult younger than age 35 years who has certain medical conditions should be immunized.  Any person who resides in a long-term care facility should be immunized.  An adult smoker should be immunized.  People with an immunocompromised condition and certain other conditions should receive both PCV13 and PPSV23 vaccines.  People with human immunodeficiency virus (HIV) infection should be immunized as soon as possible after diagnosis.  Immunization during chemotherapy or radiation therapy should be avoided.  Routine use of PPSV23 vaccine is not recommended for American Indians, [place] Natives, or people younger than 65 years unless there are medical conditions that require PPSV23 vaccine.  When indicated, people who have unknown immunization and have no record of immunization should receive PPSV23 vaccine.  One-time revaccination 5 years after the first dose of PPSV23 is recommended for people aged 19-64 years who have chronic kidney failure, nephrotic syndrome, asplenia, or immunocompromised conditions.  Meningococcal vaccine.  Adults with asplenia or persistent complement component deficiencies should receive 2 doses of quadrivalent meningococcal conjugate (MenACWY-D) vaccine. The doses should be obtained at least 2 months apart.  Microbiologists working with certain  meningococcal bacteria, [employer] recruits, people at risk during an outbreak, and people who travel to or live in countries with a high rate of meningitis should be immunized.  A first-year college student up through age 87 years who is living in a residence hall should receive a dose if he or she did not receive a dose on or after his or her 16th birthday.  Adults who have certain high-risk  conditions should receive one or more doses of vaccine.  Hepatitis A vaccine.  Adults who wish to be protected from this disease, have certain high-risk conditions, work with hepatitis A-infected animals, work in hepatitis A research labs, or travel to or work in countries with a high rate of hepatitis A should be immunized.  Adults who were previously unvaccinated and who anticipate close contact with an international adoptee during the first 60 days after arrival in the [place] from a country with a high rate of hepatitis A should be immunized.  Hepatitis B vaccine.  Adults who wish to be protected from this disease, have certain high-risk conditions, may be exposed to blood or other infectious body fluids, are household contacts or sex partners of hepatitis B positive people, are clients or workers in certain care facilities, or travel to or work in countries with a high rate of hepatitis B should be immunized.  Haemophilus influenzae type b (Hib) vaccine.  A previously unvaccinated person with asplenia or sickle cell disease or having a scheduled splenectomy should receive 1 dose of Hib vaccine.  Regardless of previous immunization, a recipient of a hematopoietic stem cell transplant should receive a 3-dose series 6-12 months after his or her successful transplant.  Hib vaccine is not recommended for adults with HIV infection. TESTING  Annual screening for vision and hearing problems is recommended. Vision should be screened at least once between 11-73 years of age.  You may be screened for anemia or tuberculosis.  You should have a blood test to check for high cholesterol.  You should be screened for alcohol and drug use.  If you are sexually active, you may be screened for sexually transmitted infections (STIs), pregnancy, or HIV. You should be screened for STIs if:  Your sexual activity has changed since the last screening test, and you are at an increased risk for  chlamydia or gonorrhea. Ask your health care provider if you are at risk.  If you are at an increased risk for hepatitis B, you should be screened for this virus. You are considered at high risk for hepatitis B if you:  Were born in a country where hepatitis B occurs often. Talk with your health care provider about which countries are considered high risk.  Have parents who were born in a high-risk country and have not received a shot to protect against hepatitis B (hepatitis B vaccine).  Have HIV or AIDS.  Use needles to inject street drugs.  Live with or have sex with someone who has hepatitis B.  Are a man who has sex with other men (MSM).  Get hemodialysis treatment.  Take certain medicines for conditions like cancer, organ transplantation, or autoimmune conditions. NUTRITION   You should:  Have three servings of low-fat milk and dairy products daily. If you do not drink milk or consume dairy products, you should eat calcium-enriched foods, such as juice, bread, or cereal. Dark, leafy greens or canned fish are alternate sources of calcium.  Drink plenty of water. Fruit juice should be limited to 8-12 oz (  240-360 mL) each day. Sugary beverages and sodas should be avoided.  Avoid eating foods high in fat, salt, or sugar, such as chips, candy, and cookies.  Avoid fast foods and limit eating out at restaurants.  Try not to skip meals, especially breakfast. You should eat a variety of vegetables, fruits, and lean meats.  Eat meals together as a family whenever possible. ORAL HEALTH Brush your teeth twice a day and floss at least once a day. You should have two dental exams a year.  SKIN CARE You should wear sunscreen when out in the sun. TALK TO SOMEONE ABOUT:  Precautions against pregnancy, contraception, and sexually transmitted infections.  Taking a prescription medicine daily to prevent HIV infection if you are at risk of being infected with HIV. This is called  preexposure prophylaxis (PrEP). You are at risk if you:  Are a female who has sex with other males (MSM).  Are heterosexual and sexually active with more than one partner.  Take drugs by injection.  Are sexually active with a partner who has HIV.  Whether you are at high risk of being infected with HIV. If you choose to begin PrEP, you should first be tested for HIV. You should then be tested every 3 months for as long as you are taking PrEP.  Drug, tobacco, and alcohol use among your friends or at friends' homes. Smoking tobacco or marijuana and taking drugs have health consequences and may impact your brain development.  Appropriate use of over-the-counter or prescription medicines.  Driving guidelines and riding with friends.  The risks of drinking and driving or boating. Call someone if you have been drinking or using drugs and need a ride. WHAT'S NEXT? Visit your pediatrician or family physician once a year. By young adulthood, you should transition from your pediatrician to a family physician or internal medicine specialist. If you are a female and are sexually active, you may want to begin annual physical exams with a gynecologist. Document Released: 01/27/2007 Document Revised: 11/06/2013 Document Reviewed: 02/16/2007 [facility] Patient Information 2015 [place], [place]. This information is not intended to replace advice given to you by your health care provider. Make sure you discuss any questions you have with your health care provider.

## 2015-04-07 ENCOUNTER — Encounter: Payer: Self-pay | Admitting: Family Medicine

## 2015-04-07 ENCOUNTER — Ambulatory Visit (INDEPENDENT_AMBULATORY_CARE_PROVIDER_SITE_OTHER): Payer: BLUE CROSS/BLUE SHIELD | Admitting: *Deleted

## 2015-04-07 DIAGNOSIS — Z111 Encounter for screening for respiratory tuberculosis: Secondary | ICD-10-CM

## 2015-04-07 DIAGNOSIS — Z7689 Persons encountering health services in other specified circumstances: Secondary | ICD-10-CM

## 2015-04-07 LAB — TB SKIN TEST
Induration: 0 mm
TB Skin Test: NEGATIVE

## 2015-04-07 NOTE — Progress Notes (Signed)
   PPD Reading Note PPD read and results entered in EpicCare. Result: 0 mm induration. Interpretation: Negative If test not read within 48-72 hours of initial placement, patient advised to repeat in other arm 1-3 weeks after this test. Allergic reaction: no  Martin, Tamika L, RN  

## 2015-04-09 NOTE — Progress Notes (Signed)
Sent letter for TST results.  Sandra Hodges M. Jimmey RalphParker, MD [facility] Resident PGY-1 04/09/2015 8:17 PM

## 2015-04-21 ENCOUNTER — Ambulatory Visit (INDEPENDENT_AMBULATORY_CARE_PROVIDER_SITE_OTHER): Payer: BLUE CROSS/BLUE SHIELD | Admitting: *Deleted

## 2015-04-21 DIAGNOSIS — Z111 Encounter for screening for respiratory tuberculosis: Secondary | ICD-10-CM

## 2015-04-21 NOTE — Progress Notes (Signed)
PPD placed Left Forearm.  Pt to return 04/23/15 for reading.  Pt tolerated intradermal injection. Madisin Hasan L, RN          

## 2015-04-23 ENCOUNTER — Ambulatory Visit (INDEPENDENT_AMBULATORY_CARE_PROVIDER_SITE_OTHER): Payer: BLUE CROSS/BLUE SHIELD | Admitting: *Deleted

## 2015-04-23 DIAGNOSIS — Z111 Encounter for screening for respiratory tuberculosis: Secondary | ICD-10-CM

## 2015-04-23 DIAGNOSIS — Z7689 Persons encountering health services in other specified circumstances: Secondary | ICD-10-CM

## 2015-04-23 LAB — TB SKIN TEST
INDURATION: 0 mm
TB SKIN TEST: NEGATIVE

## 2015-04-23 NOTE — Progress Notes (Signed)
   PPD Reading Note PPD read and results entered in EpicCare. Result: 0 mm induration. Interpretation: Negative If test not read within 48-72 hours of initial placement, patient advised to repeat in other arm 1-3 weeks after this test. Allergic reaction: no  Zlaty Alexa L, RN  

## 2015-05-02 ENCOUNTER — Telehealth: Payer: Self-pay | Admitting: Family Medicine

## 2015-05-02 DIAGNOSIS — Z8619 Personal history of other infectious and parasitic diseases: Secondary | ICD-10-CM

## 2015-05-02 NOTE — Telephone Encounter (Signed)
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 Please call pt once done so we can schedule her lab visit.

## 2015-05-02 NOTE — Telephone Encounter (Signed)
LM for patient to call back and schedule her appt. Marchella Hibbard,CMA

## 2015-05-02 NOTE — Telephone Encounter (Signed)
Will forward to PCP for review. Tenise Stetler, CMA. 

## 2015-05-02 NOTE — Telephone Encounter (Signed)
Order placed.  Sandra Hodges. Jimmey Ralph, MD [facility] Resident PGY-1 05/02/2015 8:58 AM

## 2015-05-08 ENCOUNTER — Other Ambulatory Visit: Payer: BLUE CROSS/BLUE SHIELD

## 2015-12-20 ENCOUNTER — Emergency Department (HOSPITAL_COMMUNITY): Payer: No Typology Code available for payment source

## 2015-12-20 ENCOUNTER — Emergency Department (HOSPITAL_COMMUNITY)
Admission: EM | Admit: 2015-12-20 | Discharge: 2015-12-20 | Disposition: A | Discharge status: Home / Self Care | Payer: No Typology Code available for payment source | Attending: Emergency Medicine | Admitting: Emergency Medicine

## 2015-12-20 ENCOUNTER — Encounter (HOSPITAL_COMMUNITY): Payer: Self-pay | Admitting: Emergency Medicine

## 2015-12-20 DIAGNOSIS — S3992XA Unspecified injury of lower back, initial encounter: Secondary | ICD-10-CM | POA: Insufficient documentation

## 2015-12-20 DIAGNOSIS — Z793 Long term (current) use of hormonal contraceptives: Secondary | ICD-10-CM | POA: Diagnosis not present

## 2015-12-20 DIAGNOSIS — Y998 Other external cause status: Secondary | ICD-10-CM | POA: Insufficient documentation

## 2015-12-20 DIAGNOSIS — Y92410 Unspecified street and highway as the place of occurrence of the external cause: Secondary | ICD-10-CM | POA: Diagnosis not present

## 2015-12-20 DIAGNOSIS — S6992XA Unspecified injury of left wrist, hand and finger(s), initial encounter: Secondary | ICD-10-CM | POA: Diagnosis present

## 2015-12-20 DIAGNOSIS — H9190 Unspecified hearing loss, unspecified ear: Secondary | ICD-10-CM | POA: Diagnosis not present

## 2015-12-20 DIAGNOSIS — S29002A Unspecified injury of muscle and tendon of back wall of thorax, initial encounter: Secondary | ICD-10-CM | POA: Diagnosis not present

## 2015-12-20 DIAGNOSIS — S8012XA Contusion of left lower leg, initial encounter: Secondary | ICD-10-CM | POA: Diagnosis not present

## 2015-12-20 DIAGNOSIS — Y9389 Activity, other specified: Secondary | ICD-10-CM | POA: Diagnosis not present

## 2015-12-20 DIAGNOSIS — S60222A Contusion of left hand, initial encounter: Secondary | ICD-10-CM | POA: Diagnosis not present

## 2015-12-20 NOTE — Discharge Instructions (Signed)
No limitation to use of your hand. Ice for 20 minutes, 3x per day. Motrin as needed.   Hand Contusion A hand contusion is a deep bruise on your hand area. Contusions are the result of an injury that caused bleeding under the skin. The contusion may turn blue, purple, or yellow. Minor injuries will give you a painless contusion, but more severe contusions may stay painful and swollen for a few weeks. CAUSES  A contusion is usually caused by a blow, trauma, or direct force to an area of the body. SYMPTOMS   Swelling and redness of the injured area.  Discoloration of the injured area.  Tenderness and soreness of the injured area.  Pain. DIAGNOSIS  The diagnosis can be made by taking a history and performing a physical exam. An X-ray, CT scan, or MRI may be needed to determine if there were any associated injuries, such as broken bones (fractures). TREATMENT  Often, the best treatment for a hand contusion is resting, elevating, icing, and applying cold compresses to the injured area. Over-the-counter medicines may also be recommended for pain control. HOME CARE INSTRUCTIONS   Put ice on the injured area.  Put ice in a plastic bag.  Place a towel between your skin and the bag.  Leave the ice on for 15-20 minutes, 03-04 times a day.  Only take over-the-counter or prescription medicines as directed by your caregiver. Your caregiver may recommend avoiding anti-inflammatory medicines (aspirin, ibuprofen, and naproxen) for 48 hours because these medicines may increase bruising.  If told, use an elastic wrap as directed. This can help reduce swelling. You may remove the wrap for sleeping, showering, and bathing. If your fingers become numb, cold, or blue, take the wrap off and reapply it more loosely.  Elevate your hand with pillows to reduce swelling.  Avoid overusing your hand if it is painful. SEEK IMMEDIATE MEDICAL CARE IF:   You have increased redness, swelling, or pain in your  hand.  Your swelling or pain is not relieved with medicines.  You have loss of feeling in your hand or are unable to move your fingers.  Your hand turns cold or blue.  You have pain when you move your fingers.  Your hand becomes warm to the touch.  Your contusion does not improve in 2 days. MAKE SURE YOU:   Understand these instructions.  Will watch your condition.  Will get help right away if you are not doing well or get worse.   This information is not intended to replace advice given to you by your health care provider. Make sure you discuss any questions you have with your health care provider.   Document Released: 04/23/2002 Document Revised: 07/26/2012 Document Reviewed: 04/24/2012 Elsevier Interactive Patient Education [employer]
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
## 2016-01-05 ENCOUNTER — Other Ambulatory Visit: Payer: Self-pay | Admitting: Family Medicine

## 2016-01-05 MED ORDER — NORGESTIMATE-ETH ESTRADIOL 0.25-35 MG-MCG PO TABS: 1.0000 | ORAL_TABLET | Freq: Every day | ORAL | Status: DC

## 2016-01-05 NOTE — Telephone Encounter (Signed)
Mother is calling because her daughter needs a refill on her BC called in. jw

## 2016-03-26 ENCOUNTER — Ambulatory Visit (INDEPENDENT_AMBULATORY_CARE_PROVIDER_SITE_OTHER): Payer: Self-pay | Admitting: *Deleted

## 2016-03-26 DIAGNOSIS — Z111 Encounter for screening for respiratory tuberculosis: Secondary | ICD-10-CM

## 2016-03-26 NOTE — Progress Notes (Signed)
   Patient ID: Sandra Hodges, female   DOB: 1996/11/07, 20 y.o.   MRN: [id number]   PPD placed Left Forearm.  Pt to return 03/29/16, Monday for reading.  Pt tolerated intradermal injection. Clovis PuMartin, Teran Knittle L, RN

## 2016-03-29 ENCOUNTER — Encounter: Payer: Self-pay | Admitting: *Deleted

## 2016-03-29 ENCOUNTER — Ambulatory Visit (INDEPENDENT_AMBULATORY_CARE_PROVIDER_SITE_OTHER): Payer: Self-pay | Admitting: *Deleted

## 2016-03-29 DIAGNOSIS — Z7689 Persons encountering health services in other specified circumstances: Secondary | ICD-10-CM

## 2016-03-29 DIAGNOSIS — Z111 Encounter for screening for respiratory tuberculosis: Secondary | ICD-10-CM

## 2016-03-29 LAB — TB SKIN TEST
INDURATION: 0 mm
TB Skin Test: NEGATIVE

## 2016-03-29 NOTE — Progress Notes (Signed)
   PPD Reading Note PPD read and results entered in EpicCare. Result: 0 mm induration. Interpretation: Negative If test not read within 48-72 hours of initial placement, patient advised to repeat in other arm 1-3 weeks after this test. Allergic reaction: no  Montrelle Eddings L, RN  

## 2016-04-16 ENCOUNTER — Ambulatory Visit (INDEPENDENT_AMBULATORY_CARE_PROVIDER_SITE_OTHER): Payer: BLUE CROSS/BLUE SHIELD | Admitting: Family Medicine

## 2016-04-16 ENCOUNTER — Other Ambulatory Visit (HOSPITAL_COMMUNITY)
Admission: RE | Admit: 2016-04-16 | Discharge: 2016-04-16 | Disposition: A | Discharge status: Home / Self Care | Payer: BLUE CROSS/BLUE SHIELD | Source: Ambulatory Visit | Attending: Family Medicine | Admitting: Family Medicine

## 2016-04-16 VITALS — BP 102/68 | Wt 155.0 lb

## 2016-04-16 DIAGNOSIS — Z113 Encounter for screening for infections with a predominantly sexual mode of transmission: Secondary | ICD-10-CM | POA: Diagnosis present

## 2016-04-16 DIAGNOSIS — N898 Other specified noninflammatory disorders of vagina: Secondary | ICD-10-CM

## 2016-04-16 DIAGNOSIS — Z124 Encounter for screening for malignant neoplasm of cervix: Secondary | ICD-10-CM

## 2016-04-16 DIAGNOSIS — Z01419 Encounter for gynecological examination (general) (routine) without abnormal findings: Secondary | ICD-10-CM | POA: Diagnosis present

## 2016-04-16 DIAGNOSIS — Z0000 Encounter for general adult medical examination without abnormal findings: Secondary | ICD-10-CM

## 2016-04-16 DIAGNOSIS — N760 Acute vaginitis: Secondary | ICD-10-CM

## 2016-04-16 LAB — POCT WET PREP (WET MOUNT): Clue Cells Wet Prep Whiff POC: NEGATIVE

## 2016-04-16 MED ORDER — FLUCONAZOLE 150 MG PO TABS: ORAL_TABLET | ORAL | Status: DC

## 2016-04-16 NOTE — Assessment & Plan Note (Signed)
Pap smear sent today. Discussed healthy lifestyle behaviors including safety, safe sex, diet, and exercise.   Follow up in 1 year.

## 2016-04-16 NOTE — Assessment & Plan Note (Signed)
Wet prep today negative. Will send GC/Ct with Pap. Refill medications today incase patient has another flare. Recommended femdophilus probiotics. Can consider contact allergic reaction as part of differential given significant WBCs without obvious organism. May need to avoid monostat, latex based condoms, etc to see if it improves symptoms.

## 2016-04-16 NOTE — Progress Notes (Signed)
Subjective:     Sandra Hodges is a 20 y.o. female and is here for a comprehensive physical exam. The patient reports vaginal discharge.  Vaginal Discharge. Ongoing problem for patient for several years. Was previously prescribed prophylactic diflucan and metronidazole which helped with the symptoms. Symptoms have been moderately well controlled over the past few months, though has noticed increased discharge after her periods. She treats this with monostat which helps. No fevers or chills. No nausea or vomiting.   Social History   Social History  . Marital Status: Single    Spouse Name: N/A  . Number of Children: N/A  . Years of Education: N/A   Occupational History  . Not on file.   Social History Main Topics  . Smoking status: Never Smoker   . Smokeless tobacco: Not on file  . Alcohol Use: No  . Drug Use: No  . Sexual Activity: Not on file   Other Topics Concern  . Not on file   Social History Narrative   Health Maintenance  Topic Date Due  . PAP SMEAR  01/06/2016  . TETANUS/TDAP  09/25/2022 (Originally 01/05/2014)  . INFLUENZA VACCINE  06/15/2016  . HIV Screening  Completed   The following portions of the patient's history were reviewed and updated as appropriate: allergies, current medications, past family history, past medical history, past social history, past surgical history and problem list.  Review of Systems A comprehensive review of systems was negative.   Objective:    BP 102/68 mmHg  Wt 155 lb (70.308 kg)  LMP 03/22/2016 (Approximate) General appearance: alert, cooperative, appears stated age and no distress Head: Normocephalic, without obvious abnormality, atraumatic Eyes: negative Ears: normal TM's and external ear canals both ears Nose: Nares normal. Septum midline. Mucosa normal. No drainage or sinus tenderness. Throat: lips, mucosa, and tongue normal; teeth and gums normal Neck: no adenopathy, no carotid bruit, no JVD, supple, symmetrical,  trachea midline and thyroid not enlarged, symmetric, no tenderness/mass/nodules Back: symmetric, no curvature. ROM normal. No CVA tenderness. Lungs: clear to auscultation bilaterally Heart: regular rate and rhythm, S1, S2 normal, no murmur, click, rub or gallop Abdomen: soft, non-tender; bowel sounds normal; no masses,  no organomegaly Pelvic: Normal external female genitalia. Significant amount of yellow discharge at cervical os. Cervix friable. No internal lesions noted. No CMT.  Extremities: extremities normal, atraumatic, no cyanosis or edema Pulses: 2+ and symmetric Skin: Skin color, texture, turgor normal. No rashes or lesions Lymph nodes: Cervical, supraclavicular, and axillary nodes normal. Neurologic: Grossly normal    Assessment:    Healthy female exam.      Plan:  Vaginitis and vulvovaginitis Wet prep today negative. Will send GC/Ct with Pap. Refill medications today incase patient has another flare. Recommended femdophilus probiotics. Can consider contact allergic reaction as part of differential given significant WBCs without obvious organism. May need to avoid monostat, latex based condoms, etc to see if it improves symptoms.   Healthcare maintenance Pap smear sent today. Discussed healthy lifestyle behaviors including safety, safe sex, diet, and exercise.   Follow up in 1 year.      See After Visit Summary for Counseling Recommendations

## 2016-04-16 NOTE — Patient Instructions (Signed)
Your wet prep today was negative.  Please try the femdolphilus probiotic.  I will send in a prescription for diflucan. Use this if you start having discharge after your period. You can repeat the dose in 3-5 days. If you are still having discharge, you need to be seen.  We will let you know the results of your pap test next week.  Take care,  Dr Jimmey RalphParker

## 2016-04-19 ENCOUNTER — Encounter (HOSPITAL_COMMUNITY): Payer: Self-pay | Admitting: Family Medicine

## 2016-04-19 LAB — CERVICOVAGINAL ANCILLARY ONLY
CHLAMYDIA, DNA PROBE: NEGATIVE
Neisseria Gonorrhea: NEGATIVE

## 2016-04-19 LAB — CYTOLOGY - PAP

## 2016-09-17 ENCOUNTER — Ambulatory Visit (INDEPENDENT_AMBULATORY_CARE_PROVIDER_SITE_OTHER): Payer: BLUE CROSS/BLUE SHIELD | Admitting: Women's Health

## 2016-09-17 ENCOUNTER — Encounter: Payer: Self-pay | Admitting: Women's Health

## 2016-09-17 VITALS — BP 118/80 | Ht 65.0 in | Wt 148.0 lb

## 2016-09-17 DIAGNOSIS — Z01419 Encounter for gynecological examination (general) (routine) without abnormal findings: Secondary | ICD-10-CM

## 2016-09-17 DIAGNOSIS — B3731 Acute candidiasis of vulva and vagina: Secondary | ICD-10-CM

## 2016-09-17 DIAGNOSIS — B373 Candidiasis of vulva and vagina: Secondary | ICD-10-CM | POA: Diagnosis not present

## 2016-09-17 DIAGNOSIS — N898 Other specified noninflammatory disorders of vagina: Secondary | ICD-10-CM | POA: Diagnosis not present

## 2016-09-17 DIAGNOSIS — Z308 Encounter for other contraceptive management: Secondary | ICD-10-CM

## 2016-09-17 LAB — WET PREP FOR TRICH, YEAST, CLUE
CLUE CELLS WET PREP: NONE SEEN
Trich, Wet Prep: NONE SEEN

## 2016-09-17 MED ORDER — FLUCONAZOLE 100 MG PO TABS: ORAL_TABLET | ORAL | 0 refills | Status: DC

## 2016-09-17 NOTE — Progress Notes (Signed)
Sandra Hodges 1996-04-25 [id number]    History:    Presents for new patient with problem of recurrent vaginal discharge and yeast vaginitis. Normal blood sugar, healthy lifestyle. Has been treated with Diflucan in the past with good results but symptoms shortly return, now causing dyspareunia. Regular monthly cycle/condoms/same partner with negative STD screening. Had been on OCs but noted increased discharge.  04/2016 normal Pap with negative GC/Chlamydia. Gardasil series completed.  Past medical history, past surgical history, family history and social history were all reviewed and documented in the EPIC chart. Senior in the [employer]. Mother type 2 diabetes.  ROS:  A ROS was performed and pertinent positives and negatives are included.  Exam:  Vitals:   09/17/16 1152  BP: 118/80  Weight: 148 lb (67.1 kg)  Height: 5\' 5"  (1.651 m)   Body mass index is 24.63 kg/m.   General appearance:  Normal Thyroid:  Symmetrical, normal in size, without palpable masses or nodularity. Respiratory  Auscultation:  Clear without wheezing or rhonchi Cardiovascular  Auscultation:  Regular rate, without rubs, murmurs or gallops  Edema/varicosities:  Not grossly evident Abdominal  Soft,nontender, without masses, guarding or rebound.  Liver/spleen:  No organomegaly noted  Hernia:  None appreciated  Skin  Inspection:  Grossly normal   Breasts: Examined lying and sitting.     Right: Without masses, retractions, discharge or axillary adenopathy.     Left: Without masses, retractions, discharge or axillary adenopathy. Gentitourinary   Inguinal/mons:  Normal without inguinal adenopathy  External genitalia:  Normal  BUS/Urethra/Skene's glands:  Normal  Vagina:   yellow curdy discharge wet prep positive for moderate yeast  Cervix:  Normal  Uterus:  normal in size, shape and contour.  Midline and mobile  Adnexa/parametria:     Rt: Without masses or tenderness.   Lt: Without masses or  tenderness.  Anus and perineum: Normal    Assessment/Plan:  20 y.o. S WF G0 new patient with a problem.  Recurrent yeast vaginitis/dyspareunia Monthly cycle/condoms/same partner with negative STD screen  Plan: Declines other contraception other than condoms. Plan B emergency contraception reviewed. Diflucan 100 by mouth daily for 7 days and weekly for 4 weeks and then monthly thereafter. After symptoms resolved forecasted gelcaps 600 mg per vagina twice weekly prescription, proper use given and reviewed. Instructed to call if continued problems. Reviewed importance of low sugar diet, yeast prevention discussed. Instructed to call if continued dyspareunia.   Harrington ChallengerYOUNG,Kiandre Spagnolo J WHNP, 1:02 PM 09/17/2016

## 2016-09-17 NOTE — Patient Instructions (Signed)

## 2016-09-17 NOTE — Addendum Note (Signed)
Addended by: Kem ParkinsonBARNES, Nizar Cutler on: 09/17/2016 02:13 PM   Modules accepted: Orders

## 2016-10-14 ENCOUNTER — Telehealth: Payer: Self-pay

## 2016-10-14 NOTE — Telephone Encounter (Signed)
Patient called in voice mail stating she had a few questions about her medication. I called her back and left message for her to call back. I told her she could leave her questions in voice mail as it is confidential.

## 2016-10-18 ENCOUNTER — Telehealth: Payer: Self-pay | Admitting: *Deleted

## 2016-10-18 NOTE — Telephone Encounter (Signed)
Message left

## 2016-10-18 NOTE — Telephone Encounter (Signed)
Pt called to follow up regarding office visit on 09/17/16 would like to speak with you about other options regarding taking diflucan and boric acid, states as long as she takes medication no symptoms, but when she doesn't take daily she has symptoms. [phone]

## 2016-10-19 ENCOUNTER — Other Ambulatory Visit: Payer: Self-pay | Admitting: Women's Health

## 2016-10-19 DIAGNOSIS — B3731 Acute candidiasis of vulva and vagina: Secondary | ICD-10-CM

## 2016-10-19 DIAGNOSIS — B373 Candidiasis of vulva and vagina: Secondary | ICD-10-CM

## 2016-10-19 MED ORDER — FLUCONAZOLE 100 MG PO TABS: ORAL_TABLET | ORAL | 0 refills | Status: DC

## 2016-10-19 NOTE — Telephone Encounter (Signed)
Telephone call, states symptoms are better but continues to have some symptoms, will try Diflucan 100 mg twice weekly and continue the boric acid twice weekly for the next month and call if continued problems. Aware of yeast prevention.

## 2016-11-01 ENCOUNTER — Telehealth: Payer: Self-pay

## 2016-11-01 ENCOUNTER — Other Ambulatory Visit: Payer: Self-pay | Admitting: Women's Health

## 2016-11-01 MED ORDER — FLUCONAZOLE 100 MG PO TABS: 100.0000 mg | ORAL_TABLET | ORAL | 0 refills | Status: DC

## 2016-11-01 NOTE — Telephone Encounter (Signed)
Diflucan 100mg  po twice weekly  #24

## 2016-11-01 NOTE — Telephone Encounter (Signed)
New Rx sent.

## 2016-11-01 NOTE — Telephone Encounter (Signed)
I received note from pharmacy regarding patient's Rx for Diflucan 100 mg tabs.  The note says " Patient says the doctor increased to 2 times daily. Need a new Rx."  Your note from 10/18/16  York SpanielSaid to take twice weekly. Patient needs new Rx with correct instructions on it. Please advise quantity, directions, etc.

## 2016-11-02 ENCOUNTER — Other Ambulatory Visit: Payer: Self-pay | Admitting: Women's Health

## 2016-11-26 ENCOUNTER — Other Ambulatory Visit: Payer: Self-pay | Admitting: Women's Health

## 2016-11-26 MED ORDER — FLUCONAZOLE 100 MG PO TABS: 100.0000 mg | ORAL_TABLET | ORAL | 0 refills | Status: DC

## 2017-03-22 ENCOUNTER — Other Ambulatory Visit: Payer: Self-pay

## 2017-03-22 MED ORDER — NONFORMULARY OR COMPOUNDED ITEM: 6 refills | Status: DC

## 2017-03-22 NOTE — Telephone Encounter (Signed)
Okay for refill?  

## 2017-03-22 NOTE — Telephone Encounter (Signed)
Called into pharmacy

## 2017-03-29 ENCOUNTER — Ambulatory Visit (INDEPENDENT_AMBULATORY_CARE_PROVIDER_SITE_OTHER): Payer: BLUE CROSS/BLUE SHIELD | Admitting: Family Medicine

## 2017-03-29 ENCOUNTER — Encounter: Payer: Self-pay | Admitting: Family Medicine

## 2017-03-29 DIAGNOSIS — F411 Generalized anxiety disorder: Secondary | ICD-10-CM | POA: Diagnosis not present

## 2017-03-29 MED ORDER — CITALOPRAM HYDROBROMIDE 10 MG PO TABS: 10.0000 mg | ORAL_TABLET | Freq: Every day | ORAL | 3 refills | Status: DC

## 2017-03-29 NOTE — Progress Notes (Signed)
    Subjective:  Sandra Hodges is a 21 y.o. female who [facility] today with a chief complaint of anxiety.   HPI:  Anxiety Chronic problem for patient. Has had panic attacks in the past, but none recently. Has noticed increased levels of stress and anxiety over the past few weeks. 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 Feels like she worries too mucha nd has some trouble relaxing. No SI or HI.   ROS: Per HPI  PMH: Smoking history reviewed.   Objective:  Physical Exam: BP 102/78   Pulse 78   Temp 98.1 F (36.7 C) (Oral)   Ht 5\' 5"  (1.651 m)   Wt 153 lb 6.4 oz (69.6 kg)   LMP 03/15/2017 (Approximate)   SpO2 99%   BMI 25.53 kg/m   Gen: NAD, resting comfortably MSK: no edema, cyanosis, or clubbing noted Skin: warm, dry Neuro: grossly normal, moves all extremities Psych: Normal affect and thought content, No AVH, SI, or HI  Assessment/Plan:  Generalized anxiety disorder GAD-7 score of 17 today. Will start citalopram. [facility] however patient deferred. Follow up in 2-3 weeks. Increase dose as tolerated.   Katina Degreealeb M. Jimmey RalphParker, MD [facility] Resident PGY-3 03/29/2017 1:59 PM

## 2017-03-29 NOTE — Assessment & Plan Note (Signed)
GAD-7 score of 17 today. Will start citalopram. [facility] however patient deferred. Follow up in 2-3 weeks. Increase dose as tolerated.

## 2017-03-30 ENCOUNTER — Encounter: Payer: Self-pay | Admitting: Gynecology

## 2017-04-29 ENCOUNTER — Ambulatory Visit (INDEPENDENT_AMBULATORY_CARE_PROVIDER_SITE_OTHER): Payer: BLUE CROSS/BLUE SHIELD | Admitting: Family Medicine

## 2017-04-29 ENCOUNTER — Encounter: Payer: Self-pay | Admitting: Family Medicine

## 2017-04-29 DIAGNOSIS — F411 Generalized anxiety disorder: Secondary | ICD-10-CM

## 2017-04-29 MED ORDER — CITALOPRAM HYDROBROMIDE 20 MG PO TABS: 20.0000 mg | ORAL_TABLET | Freq: Every day | ORAL | 5 refills | Status: DC

## 2017-04-29 MED ORDER — CITALOPRAM HYDROBROMIDE 10 MG PO TABS: 10.0000 mg | ORAL_TABLET | Freq: Every day | ORAL | 3 refills | Status: DC

## 2017-04-29 NOTE — Patient Instructions (Signed)
I am glad that you are doing better!  Increase celexa to 20mg .   Come back to meet Dr Sydnee Cabaliallo soon.  Take care,  Dr Jimmey RalphParker

## 2017-04-29 NOTE — Assessment & Plan Note (Signed)
GAD-7 score improved to 11 today. Patient feels like she is doing much better. Will increase dose to 20mg  daily. Follow up in 4-6 weeks with new PCP. Titrate as tolerated.

## 2017-04-29 NOTE — Progress Notes (Signed)
    Subjective:  Sandra Hodges is a 21 y.o. female [facility] with a chief complaint of anxiety.   HPI:  Anxiety Chronic, stable. Seen last month and started on celexa 10mg  daily. Has done well with this dose. She thought it may have been causing her to feel more tired and switch to taking her medications at night which helped. 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 No SI or HI.   ROS: Per HPI  Objective:  Physical Exam: BP 110/60   Pulse 73   Temp 98.3 F (36.8 C) (Oral)   Ht 5\' 5"  (1.651 m)   Wt 152 lb (68.9 kg)   SpO2 99%   BMI 25.29 kg/m   Gen: NAD, resting comfortably Psych: Normal affect and thought content, No SI or HI.   Assessment/Plan:  Generalized anxiety disorder GAD-7 score improved to 11 today. Patient feels like she is doing much better. Will increase dose to 20mg  daily. Follow up in 4-6 weeks with new PCP. Titrate as tolerated.   Katina Degreealeb M. Jimmey RalphParker, MD [facility] Resident PGY-3 04/29/2017 9:32 AM

## 2017-05-24 ENCOUNTER — Other Ambulatory Visit: Payer: Self-pay | Admitting: Family Medicine
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
## 2017-07-26 DIAGNOSIS — K0510 Chronic gingivitis, plaque induced: Secondary | ICD-10-CM | POA: Diagnosis not present

## 2017-07-26 DIAGNOSIS — K148 Other diseases of tongue: Secondary | ICD-10-CM | POA: Diagnosis not present

## 2017-07-26 DIAGNOSIS — Z1281 Encounter for screening for malignant neoplasm of oral cavity: Secondary | ICD-10-CM | POA: Diagnosis not present

## 2017-07-26 DIAGNOSIS — J342 Deviated nasal septum: Secondary | ICD-10-CM | POA: Diagnosis not present

## 2017-07-26 DIAGNOSIS — M2669 Other specified disorders of temporomandibular joint: Secondary | ICD-10-CM | POA: Diagnosis not present

## 2017-07-26 DIAGNOSIS — K063 Horizontal alveolar bone loss: Secondary | ICD-10-CM | POA: Diagnosis not present

## 2017-08-30 ENCOUNTER — Encounter: Payer: Self-pay | Admitting: Emergency Medicine

## 2017-08-30 ENCOUNTER — Ambulatory Visit
Admission: EM | Admit: 2017-08-30 | Discharge: 2017-08-30 | Disposition: A | Discharge status: Home / Self Care | Payer: 59 | Attending: Family Medicine | Admitting: Family Medicine

## 2017-08-30 DIAGNOSIS — J3489 Other specified disorders of nose and nasal sinuses: Secondary | ICD-10-CM | POA: Diagnosis not present

## 2017-08-30 DIAGNOSIS — J029 Acute pharyngitis, unspecified: Secondary | ICD-10-CM | POA: Diagnosis not present

## 2017-08-30 LAB — RAPID STREP SCREEN (MED CTR MEBANE ONLY): Streptococcus, Group A Screen (Direct): NEGATIVE

## 2017-08-30 NOTE — ED Provider Notes (Signed)
[facility]    CSN: [id number] Arrival date & time: 08/30/17  7829  History   Chief Complaint Chief Complaint  Patient presents with  . Sore Throat   HPI  21 year old female presents with sore throat.  Patient states that her sore throat started last night. She reports associated runny nose. Sore throat is severe. No known exacerbating or relieving factors. No medications tried. She is concerned that she has strep throat. No reported sick contacts. No other associated symptoms. No other complaints or concerns this time.  PMH - GAD  Patient Active Problem List   Diagnosis Date Noted  . Generalized anxiety disorder 03/29/2017  . Healthcare maintenance 04/16/2016  . Vaginitis and vulvovaginitis 09/28/2013  . Contraception management 08/18/2011   Past Surgical History:  Procedure Laterality Date  . ADENOIDECTOMY    . WISDOM TOOTH EXTRACTION     OB History    Gravida Para Term Preterm AB Living   0 0 0 0 0 0   SAB TAB Ectopic Multiple Live Births   0 0 0 0 0      Home Medications    Prior to Admission medications   Medication Sig Start Date End Date Taking? Authorizing Provider  citalopram (CELEXA) 20 MG tablet Take 1 tablet (20 mg total) by mouth daily. 04/29/17  Yes Ardith Dark, MD  NONFORMULARY OR COMPOUNDED ITEM Boric Acid Capsules 600 mg. #30 S: insert one caps vaginally twice weekly. 03/22/17  Yes Harrington Challenger, NP  SPRINTEC 28 0.25-35 MG-MCG tablet TAKE ONE TABLET BY MOUTH ONCE DAILY 05/27/17  Yes Diallo, Abdoulaye, MD  Cyanocobalamin (VITAMIN B-12 PO) Take 1 tablet by mouth daily.    [provider]  Probiotic Product (PROBIOTIC PO) Take 1 tablet by mouth daily.    [provider]   Family History Family History  Problem Relation Age of Onset  . Hypertension Mother   . Diabetes Mother   . Bipolar disorder Mother   . Mental illness Sister   . Skin cancer Maternal Grandmother   . Heart disease Maternal Grandfather   .  Diabetes Paternal Grandmother   . Heart disease Paternal Grandfather    Social History Social History  Substance Use Topics  . Smoking status: Never Smoker  . Smokeless tobacco: Never Used  . Alcohol use Yes   Allergies   Patient has no known allergies.   Review of Systems Review of Systems  Constitutional: Negative for chills and fever.  HENT: Positive for rhinorrhea and sore throat.   Respiratory: Negative.    Physical Exam Triage Vital Signs ED Triage Vitals [08/30/17 0944]  Enc Vitals Group     BP 106/73     Pulse Rate 92     Resp 16     Temp 98.5 F (36.9 C)     Temp Source Oral     SpO2 99 %     Weight 153 lb (69.4 kg)     Height  (1.651 m)     Head Circumference      Peak Flow      Pain Score 4     Pain Loc      Pain Edu?      Excl. in GC?    Updated Vital Signs BP 106/73 (BP Location: Left Arm)   Pulse 92   Temp 98.5 F (36.9 C) (Oral)   Resp 16   Ht  (1.651 m)   Wt 153 lb (69.4 kg)   LMP  08/15/2017 (Approximate)   SpO2 99%   BMI 25.46 kg/m  Physical Exam  Constitutional: She is oriented to person, place, and time. She appears well-developed. No distress.  HENT:  Mild oropharyngeal erythema. No exudate. TMs without erythema.  Eyes: Conjunctivae are normal. Right eye exhibits no discharge. Left eye exhibits no discharge.  Neck: Neck supple.  Cardiovascular: Normal rate and regular rhythm.   No murmur heard. Pulmonary/Chest: Effort normal and breath sounds normal. She has no wheezes. She has no rales.  Lymphadenopathy:    She has no cervical adenopathy.  Neurological: She is alert and oriented to person, place, and time.  Skin: Skin is warm. No rash noted.  Psychiatric: She has a normal mood and affect.  Vitals reviewed.  UC Treatments / Results  Labs (all labs ordered are listed, but only abnormal results are displayed) Labs Reviewed  RAPID STREP SCREEN (NOT AT [facility])  CULTURE, GROUP A STREP [facility])   EKG  EKG  Interpretation None      Radiology No results found.  Procedures Procedures (including critical care time)  Medications Ordered in UC Medications - No data to display   Initial Impression / Assessment and Plan / UC Course  I have reviewed the triage vital signs and the nursing notes.  Pertinent labs & imaging results that were available during my care of the patient were reviewed by me and considered in my medical decision making (see chart for details).    21 year old female presents with sore throat. Rapid strep negative. Likely viral. Supportive care. Advised ibuprofen 800 mg 3 times a day as needed.  Final Clinical Impressions(s) / UC Diagnoses   Final diagnoses:  Pharyngitis, unspecified etiology   New Prescriptions Discharge Medication List as of 08/30/2017 10:19 AM     Controlled Substance Prescriptions [place] Controlled Substance Registry consulted? Not Applicable   Tommie Sams, DO 08/30/17 1045

## 2017-08-30 NOTE — Discharge Instructions (Signed)
This is likely viral.  Ibuprofen 800 mg as needed.  Take care  Dr. Adriana Simas

## 2017-08-30 NOTE — ED Triage Notes (Signed)
Patient in today c/o sore throat and swollen/sore neck glands x 2 days. Patient denies fever. Patient has not tried anything OTC for her symptoms.

## 2017-09-02 LAB — CULTURE, GROUP A STREP (THRC)

## 2017-11-11 ENCOUNTER — Encounter: Payer: Self-pay | Admitting: Family Medicine

## 2017-11-11 ENCOUNTER — Ambulatory Visit (INDEPENDENT_AMBULATORY_CARE_PROVIDER_SITE_OTHER): Payer: 59 | Admitting: Family Medicine

## 2017-11-11 ENCOUNTER — Other Ambulatory Visit: Payer: Self-pay

## 2017-11-11 VITALS — BP 108/70 | HR 104 | Temp 98.5°F | Ht 65.0 in | Wt 159.0 lb

## 2017-11-11 DIAGNOSIS — R220 Localized swelling, mass and lump, head: Secondary | ICD-10-CM

## 2017-11-11 DIAGNOSIS — M278 Other specified diseases of jaws: Secondary | ICD-10-CM

## 2017-11-11 NOTE — Progress Notes (Signed)
   Subjective:    Patient ID: Sandra Hodges, female    DOB: 04-09-1996, 21 y.o.   MRN: [id number]   CC: Mass on jawline  HPI: Patient is a 21 yo female who presents here today for evaluation of a small mobile mass on her left jaw. Patient reports she noticed it two week ago and have been monitoring it. It is mobile and if manipulated can be painful at times. Patient denies prior presentation. She has not tried anything for it. No change in eating habits, bleeding or drainage in the inside of her mouth. Patient denies any fever, chills, URI symptoms.   Smoking status reviewed   ROS: all other systems were reviewed and are negative other than in the HPI   History reviewed. No pertinent past medical history.  Past Surgical History:  Procedure Laterality Date  . ADENOIDECTOMY    . WISDOM TOOTH EXTRACTION      Past medical history, surgical, family, and social history reviewed and updated in the EMR as appropriate.  Objective:  BP 108/70   Pulse (!) 104   Temp 98.5 F (36.9 C) (Oral)   Ht 5\' 5"  (1.651 m)   Wt 159 lb (72.1 kg)   LMP 11/10/2017 (Exact Date)   SpO2 99%   BMI 26.46 kg/m   Vitals and nursing note reviewed  General: NAD, pleasant, able to participate in exam HEENT: Small 1x1 cm mobile mass left jaw line, MMM, oral mucosa with no drainage or erythema. No lymphadenopathy. No skin changes. Right jaw and neck unremarkable. Cardiac: RRR, normal heart sounds, no murmurs. 2+ radial and PT pulses bilaterally Respiratory: CTAB, normal effort, No wheezes, rales or rhonchi Abdomen: soft, nontender, nondistended, no hepatic or splenomegaly, +BS Extremities: no edema or cyanosis. WWP. Skin: warm and dry, no rashes noted Neuro: alert and oriented x4, no focal deficits Psych: Normal affect and mood   Assessment & Plan:   #Mobile jaw line mass, subacute Patient presents with small mobile jaw line mass for the past two weeks. Painful with manipulation, no skin changes, no  prior history. Unclear etiology. Normal vitals and no signs of infection. Unclear etiology. Significant family history for cancer. Differential would lipoma vs cyst vs reactive lymph nodes. Will refer to ENT for further evaluation.    Lovena NeighboursAbdoulaye Allyah Heather, MD [facility] PGY-2

## 2017-11-11 NOTE — Patient Instructions (Signed)
It was great seeing you today! We have addressed the following issues today  1. We made a referral to ENT and they will call you for an appointment. We can follow up in clinic after your appointment as needed.  If we did any lab work today, and the results require attention, either me or my nurse will get in touch with you. If everything is normal, you will get a letter in mail and a message via . If you don't hear from us in two weeks, please give us a call. Otherwise, we look forward to seeing you again at your next visit. If you have any questions or concerns before then, please call the clinic at [phone].  Please bring all your medications to every doctors visit  Sign up for My Chart to have easy access to your labs results, and communication with your Primary care physician. Please ask Front Desk for some assistance.   Please check-out at the front desk before leaving the clinic.    Take Care,   Dr. Sydnee Cabaliallo

## 2017-11-21 ENCOUNTER — Telehealth: Payer: Self-pay | Admitting: Family Medicine

## 2017-11-21 NOTE — Telephone Encounter (Signed)
[facility]. Pt would like us to call and see whats going---like why they havent called for an appt. Please advise

## 2017-11-22 NOTE — Telephone Encounter (Signed)
I have re-faxed the referral paperwork to [facility] 11/22/17. Myriam Jacobsonjw

## 2017-11-25 ENCOUNTER — Other Ambulatory Visit: Payer: Self-pay | Admitting: Family Medicine

## 2017-11-30 DIAGNOSIS — K1379 Other lesions of oral mucosa: Secondary | ICD-10-CM | POA: Diagnosis not present

## 2018-05-12 ENCOUNTER — Other Ambulatory Visit: Payer: Self-pay

## 2018-05-12 ENCOUNTER — Encounter: Payer: Self-pay | Admitting: Family Medicine

## 2018-05-12 ENCOUNTER — Ambulatory Visit (INDEPENDENT_AMBULATORY_CARE_PROVIDER_SITE_OTHER): Payer: 59 | Admitting: Family Medicine

## 2018-05-12 VITALS — BP 118/82 | HR 91 | Temp 98.2°F | Ht 65.0 in | Wt 165.0 lb

## 2018-05-12 DIAGNOSIS — Z0000 Encounter for general adult medical examination without abnormal findings: Secondary | ICD-10-CM

## 2018-05-12 DIAGNOSIS — F32A Depression, unspecified: Secondary | ICD-10-CM

## 2018-05-12 DIAGNOSIS — F329 Major depressive disorder, single episode, unspecified: Secondary | ICD-10-CM

## 2018-05-12 DIAGNOSIS — F419 Anxiety disorder, unspecified: Secondary | ICD-10-CM | POA: Diagnosis not present

## 2018-05-12 MED ORDER — CITALOPRAM HYDROBROMIDE 40 MG PO TABS: 40.0000 mg | ORAL_TABLET | Freq: Every day | ORAL | 0 refills | Status: DC

## 2018-05-12 NOTE — Patient Instructions (Signed)
I increased your Celexa to 40 mg daily. Please see your PCP in 4 weeks for reassessment and medication monitoring.   Citalopram tablets What is this medicine? CITALOPRAM (sye TAL oh pram) is a medicine for depression. This medicine may be used for other purposes; ask your health care provider or pharmacist if you have questions. COMMON BRAND NAME(S): Celexa What should I tell my health care provider before I take this medicine? They need to know if you have any of these conditions: -bleeding disorders -bipolar disorder or a family history of bipolar disorder -glaucoma -heart disease -history of irregular heartbeat -kidney disease -liver disease -low levels of magnesium or potassium in the blood -receiving electroconvulsive therapy -seizures -suicidal thoughts, plans, or attempt; a previous suicide attempt by you or a family member -take medicines that treat or prevent blood clots -thyroid disease -an unusual or allergic reaction to citalopram, escitalopram, other medicines, foods, dyes, or preservatives -pregnant or trying to become pregnant -breast-feeding How should I use this medicine? Take this medicine by mouth with a glass of water. Follow the directions on the prescription label. You can take it with or without food. Take your medicine at regular intervals. Do not take your medicine more often than directed. Do not stop taking this medicine suddenly except upon the advice of your doctor. Stopping this medicine too quickly may cause serious side effects or your condition may worsen. A special MedGuide will be given to you by the pharmacist with each prescription and refill. Be sure to read this information carefully each time. Talk to your pediatrician regarding the use of this medicine in children. Special care may be needed. Patients over 64 years old may have a stronger reaction and need a smaller dose. Overdosage: If you think you have taken too much of this medicine contact  a poison control center or emergency room at once. NOTE: This medicine is only for you. Do not share this medicine with others. What if I miss a dose? If you miss a dose, take it as soon as you can. If it is almost time for your next dose, take only that dose. Do not take double or extra doses. What may interact with this medicine? Do not take this medicine with any of the following medications: -certain medicines for fungal infections like fluconazole, itraconazole, ketoconazole, posaconazole, voriconazole -cisapride -dofetilide -dronedarone -escitalopram -linezolid -MAOIs like Carbex, Eldepryl, Marplan, Nardil, and Parnate -methylene blue (injected into a vein) -pimozide -thioridazine -ziprasidone This medicine may also interact with the following medications: -alcohol -amphetamines -aspirin and aspirin-like medicines -carbamazepine -certain medicines for depression, anxiety, or psychotic disturbances -certain medicines for infections like chloroquine, clarithromycin, erythromycin, furazolidone, isoniazid, pentamidine -certain medicines for migraine headaches like almotriptan, eletriptan, frovatriptan, naratriptan, rizatriptan, sumatriptan, zolmitriptan -certain medicines for sleep -certain medicines that treat or prevent blood clots like dalteparin, enoxaparin, warfarin -cimetidine -diuretics -fentanyl -lithium -methadone -metoprolol -NSAIDs, medicines for pain and inflammation, like ibuprofen or naproxen -omeprazole -other medicines that prolong the QT interval (cause an abnormal heart rhythm) -procarbazine -rasagiline -supplements like St. John's wort, kava kava, valerian -tramadol -tryptophan This list may not describe all possible interactions. Give your health care provider a list of all the medicines, herbs, non-prescription drugs, or dietary supplements you use. Also tell them if you smoke, drink alcohol, or use illegal drugs. Some items may interact with your  medicine. What should I watch for while using this medicine? Tell your doctor if your symptoms do not get better or if they get worse. Visit  your doctor or health care professional for regular checks on your progress. Because it may take several weeks to see the full effects of this medicine, it is important to continue your treatment as prescribed by your doctor. Patients and their families should watch out for new or worsening thoughts of suicide or depression. Also watch out for sudden changes in feelings such as feeling anxious, agitated, panicky, irritable, hostile, aggressive, impulsive, severely restless, overly excited and hyperactive, or not being able to sleep. If this happens, especially at the beginning of treatment or after a change in dose, call your health care professional. Bonita QuinYou may get drowsy or dizzy. Do not drive, use machinery, or do anything that needs mental alertness until you know how this medicine affects you. Do not stand or sit up quickly, especially if you are an older patient. This reduces the risk of dizzy or fainting spells. Alcohol may interfere with the effect of this medicine. Avoid alcoholic drinks. Your mouth may get dry. Chewing sugarless gum or sucking hard candy, and drinking plenty of water will help. Contact your doctor if the problem does not go away or is severe. What side effects may I notice from receiving this medicine? Side effects that you should report to your doctor or health care professional as soon as possible: -allergic reactions like skin rash, itching or hives, swelling of the face, lips, or tongue -anxious -black, tarry stools -breathing problems -changes in vision -chest pain -confusion -elevated mood, decreased need for sleep, racing thoughts, impulsive behavior -eye pain -fast, irregular heartbeat -feeling faint or lightheaded, falls -feeling agitated, angry, or irritable -hallucination, loss of contact with reality -loss of balance or  coordination -loss of memory -painful or prolonged erections -restlessness, pacing, inability to keep still -seizures -stiff muscles -suicidal thoughts or other mood changes -trouble sleeping -unusual bleeding or bruising -unusually weak or tired -vomiting Side effects that usually do not require medical attention (report to your doctor or health care professional if they continue or are bothersome): -change in appetite or weight -change in sex drive or performance -dizziness -headache -increased sweating -indigestion, nausea -tremors This list may not describe all possible side effects. Call your doctor for medical advice about side effects. You may report side effects to FDA at 1-800-FDA-1088. Where should I keep my medicine? Keep out of reach of children. Store at room temperature between 15 and 30 degrees C (59 and 86 degrees F). Throw away any unused medicine after the expiration date. NOTE: This sheet is a summary. It may not cover all possible information. If you have questions about this medicine, talk to your doctor, pharmacist, or health care provider.  2018 Elsevier/Gold Standard (2016-04-05 13:18:52)

## 2018-05-12 NOTE — Progress Notes (Signed)
Subjective:     Sandra Hodges is a 22 y.o. female and is here for a comprehensive physical exam. The patient reports problems - Depression and anxiety. She has had this for many years. She started Celexa a year ago. She does not not feel this is working at the moment. Stressed at work she handles it well.  Social History   Socioeconomic History  . Marital status: Single    Spouse name: Not on file  . Number of children: Not on file  . Years of education: Not on file  . Highest education level: Not on file  Occupational History  . Not on file  Social Needs  . Financial resource strain: Not on file  . Food insecurity:    Worry: Not on file    Inability: Not on file  . Transportation needs:    Medical: Not on file    Non-medical: Not on file  Tobacco Use  . Smoking status: Never Smoker  . Smokeless tobacco: Never Used  Substance and Sexual Activity  . Alcohol use: Yes  . Drug use: No  . Sexual activity: Yes    Birth control/protection: Condom  Lifestyle  . Physical activity:    Days per week: Not on file    Minutes per session: Not on file  . Stress: Not on file  Relationships  . Social connections:    Talks on phone: Not on file    Gets together: Not on file    Attends religious service: Not on file    Active member of club or organization: Not on file    Attends meetings of clubs or organizations: Not on file    Relationship status: Not on file  . Intimate partner violence:    Fear of current or ex partner: Not on file    Emotionally abused: Not on file    Physically abused: Not on file    Forced sexual activity: Not on file  Other Topics Concern  . Not on file  Social History Narrative  . Not on file   Health Maintenance  Topic Date Due  . INFLUENZA VACCINE  06/15/2018  . PAP SMEAR  04/17/2019  . TETANUS/TDAP  09/25/2022  . HIV Screening  Completed    The following portions of the patient's history were reviewed and updated as appropriate: allergies,  current medications, past family history, past medical history, past social history, past surgical history and problem list.  Review of Systems Pertinent items noted in HPI and remainder of comprehensive ROS otherwise negative.   Objective:    BP 118/82   Pulse 91   Temp 98.2 F (36.8 C) (Oral)   Ht 5\' 5"  (1.651 m)   Wt 165 lb (74.8 kg)   LMP 04/28/2018 (Approximate)   SpO2 98%   BMI 27.46 kg/m  General appearance: alert, cooperative and appears stated age Head: Normocephalic, without obvious abnormality, atraumatic Eyes: conjunctivae/corneas clear. PERRL, EOM's intact. Fundi benign. Ears: normal TM's and external ear canals both ears Throat: lips, mucosa, and tongue normal; teeth and gums normal Neck: no adenopathy, no carotid bruit, no JVD, supple, symmetrical, trachea midline and thyroid not enlarged, symmetric, no tenderness/mass/nodules Lungs: clear to auscultation bilaterally Heart: regular rate and rhythm, S1, S2 normal, no murmur, click, rub or gallop Abdomen: soft, non-tender; bowel sounds normal; no masses,  no organomegaly Pelvic: Deferred. She get examined by her gynecologist. Extremities: extremities normal, atraumatic, no cyanosis or edema Pulses: 2+ and symmetric Skin: Skin color, texture, turgor  normal. No rashes or lesions Lymph nodes: Cervical, supraclavicular, and axillary nodes normal. Neurologic: Alert and oriented X 3, normal strength and tone. Normal symmetric reflexes. Normal coordination and gait    Assessment:    Healthy female exam.     Depression Plan:     See After Visit Summary for Counseling Recommendations. Healthy female. See PHQ2/9 score. Hx of intermittent flaring of her anxiety and depression. She requested increase med dose. She feels well today. Celexa increased to 40 mg. S/E of meds discussed. Advised to return in 4 weeks to see PCP for med effect evaluation and refills. Age appropriate counseling done. F/U in 1 yr for physical  exam.

## 2018-05-29 ENCOUNTER — Other Ambulatory Visit: Payer: Self-pay | Admitting: Family Medicine

## 2018-06-20 ENCOUNTER — Other Ambulatory Visit: Payer: Self-pay

## 2018-06-20 MED ORDER — CITALOPRAM HYDROBROMIDE 40 MG PO TABS: 40.0000 mg | ORAL_TABLET | Freq: Every day | ORAL | 1 refills | Status: DC

## 2018-06-20 NOTE — Telephone Encounter (Signed)
Patient doing 100% better on increased dose of Citalopram. Has joined a book club and started working out again. Works nights as a Engineer, civil (consulting)nurse at ED at [employer] and it is difficult to schedule an appt with her schedule. Can she get a refill without an appt?  Ples SpecterAlisa Homero Hyson, RN [facility] RN)

## 2018-08-09 DIAGNOSIS — K0500 Acute gingivitis, plaque induced: Secondary | ICD-10-CM | POA: Diagnosis not present

## 2018-08-09 DIAGNOSIS — Z1281 Encounter for screening for malignant neoplasm of oral cavity: Secondary | ICD-10-CM | POA: Diagnosis not present

## 2018-08-23 ENCOUNTER — Ambulatory Visit (INDEPENDENT_AMBULATORY_CARE_PROVIDER_SITE_OTHER): Payer: 59 | Admitting: Women's Health

## 2018-08-23 ENCOUNTER — Encounter: Payer: Self-pay | Admitting: Women's Health

## 2018-08-23 VITALS — BP 122/76 | Ht 65.0 in | Wt 161.0 lb

## 2018-08-23 DIAGNOSIS — Z01419 Encounter for gynecological examination (general) (routine) without abnormal findings: Secondary | ICD-10-CM

## 2018-08-23 DIAGNOSIS — N898 Other specified noninflammatory disorders of vagina: Secondary | ICD-10-CM

## 2018-08-23 DIAGNOSIS — Z13220 Encounter for screening for lipoid disorders: Secondary | ICD-10-CM | POA: Diagnosis not present

## 2018-08-23 LAB — WET PREP FOR TRICH, YEAST, CLUE

## 2018-08-23 MED ORDER — NONFORMULARY OR COMPOUNDED ITEM: 6 refills | Status: AC

## 2018-08-23 MED ORDER — NORGESTIMATE-ETH ESTRADIOL 0.25-35 MG-MCG PO TABS: 1.0000 | ORAL_TABLET | Freq: Every day | ORAL | 4 refills | Status: AC

## 2018-08-23 NOTE — Patient Instructions (Signed)

## 2018-08-23 NOTE — Progress Notes (Signed)
Sandra Hodges 01/21/1996 [id number]    History:    Presents for annual exam.  Regular monthly cycle on Sprintec without complaint.  Normal Pap history.  Gardasil series completed.  History of recurrent yeast using boric acid gelcaps 1-2 times weekly with good relief of symptoms.  Getting married next month, not desiring children for several years.  Past medical history, past surgical history, family history and social history were all reviewed and documented in the EPIC chart.  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  Mother diabetes on meds.  ROS:  A ROS was performed and pertinent positives and negatives are included.  Exam:  Vitals:   08/23/18 1505  BP: 122/76  Weight: 161 lb (73 kg)  Height: 5\' 5"  (1.651 m)   Body mass index is 26.79 kg/m.   General appearance:  Normal Thyroid:  Symmetrical, normal in size, without palpable masses or nodularity. Respiratory  Auscultation:  Clear without wheezing or rhonchi Cardiovascular  Auscultation:  Regular rate, without rubs, murmurs or gallops  Edema/varicosities:  Not grossly evident Abdominal  Soft,nontender, without masses, guarding or rebound.  Liver/spleen:  No organomegaly noted  Hernia:  None appreciated  Skin  Inspection:  Grossly normal   Breasts: Examined lying and sitting. Bilateral piercing    Right: Without masses, retractions, discharge or axillary adenopathy.     Left: Without masses, retractions, discharge or axillary adenopathy. Gentitourinary   Inguinal/mons:  Normal without inguinal adenopathy  External genitalia:  Normal  BUS/Urethra/Skene's glands:  Normal  Vagina:  Normal  Cervix:  Normal  Uterus:   normal in size, shape and contour.  Midline and mobile  Adnexa/parametria:     Rt: Without masses or tenderness.   Lt: Without masses or tenderness.  Anus and perineum: Normal    Assessment/Plan:  22 y.o. engaged WF G0 for annual exam with no complaints.  Monthly cycle on Sprintec History of recurrent yeast good relief  with boric acid gelcap suppression Anxiety/Depression stable on Celexa-primary care manages  Plan: Sprintec prescription, proper use, slight risk for blood clots and strokes reviewed.  Boric acid gelcaps 600 mg 1-2 times weekly prescription given.  Wet prep negative today reviewed normality of exam and wet prep.  SBE's, exercise, calcium rich foods, continue MVI daily.  CBC, rubella titer, Glucose, lipid panel.  Pap normal 2017, new screening guidelines reviewed.Harrington Challenger [facility], 3:51 PM 08/23/2018

## 2018-08-24 LAB — CBC WITH DIFFERENTIAL/PLATELET
Basophils Absolute: 52 cells/uL (ref 0–200)
Basophils Relative: 0.8 %
EOS ABS: 98 {cells}/uL (ref 15–500)
Eosinophils Relative: 1.5 %
HEMATOCRIT: 35.1 % (ref 35.0–45.0)
HEMOGLOBIN: 11 g/dL — AB (ref 11.7–15.5)
LYMPHS ABS: 2392 {cells}/uL (ref 850–3900)
MCH: 24.9 pg — AB (ref 27.0–33.0)
MCHC: 31.3 g/dL — ABNORMAL LOW (ref 32.0–36.0)
MCV: 79.6 fL — ABNORMAL LOW (ref 80.0–100.0)
MONOS PCT: 9.1 %
MPV: 10.8 fL (ref 7.5–12.5)
NEUTROS ABS: 3367 {cells}/uL (ref 1500–7800)
NEUTROS PCT: 51.8 %
Platelets: 328 10*3/uL (ref 140–400)
RBC: 4.41 10*6/uL (ref 3.80–5.10)
RDW: 17 % — AB (ref 11.0–15.0)
Total Lymphocyte: 36.8 %
WBC mixed population: 592 cells/uL (ref 200–950)
WBC: 6.5 10*3/uL (ref 3.8–10.8)

## 2018-08-24 LAB — LIPID PANEL
CHOL/HDL RATIO: 2.7 (calc) (ref <5.0)
CHOLESTEROL: 121 mg/dL (ref <200)
HDL: 45 mg/dL — ABNORMAL LOW (ref >50)
LDL CHOLESTEROL (CALC): 62 mg/dL
Non-HDL Cholesterol (Calc): 76 mg/dL (calc) (ref <130)
Triglycerides: 62 mg/dL (ref <150)

## 2018-08-24 LAB — RUBELLA SCREEN: Rubella: 2.23 index

## 2018-08-24 LAB — GLUCOSE, RANDOM: GLUCOSE: 75 mg/dL (ref 65–99)

## 2019-01-01 ENCOUNTER — Other Ambulatory Visit: Payer: Self-pay | Admitting: Family Medicine

## 2019-04-13 ENCOUNTER — Other Ambulatory Visit: Payer: Self-pay | Admitting: Family Medicine

## 2019-04-13 MED ORDER — CITALOPRAM HYDROBROMIDE 40 MG PO TABS: 40.0000 mg | ORAL_TABLET | Freq: Every day | ORAL | 0 refills | Status: DC

## 2019-04-13 NOTE — Telephone Encounter (Signed)
Pt is out of meds. Jone Baseman, CMA

## 2019-07-20 ENCOUNTER — Other Ambulatory Visit: Payer: Self-pay

## 2019-07-20 MED ORDER — CITALOPRAM HYDROBROMIDE 40 MG PO TABS: 40.0000 mg | ORAL_TABLET | Freq: Every day | ORAL | 0 refills | Status: DC

## 2019-07-20 NOTE — Telephone Encounter (Signed)
Patient calls nurse lien stating she knows she has not been seen in over a year, patient has moved out of the area. Patient has an apt to establish care with her new PCP, 08/17/2019. Patient is asking for 1 month supply of Celexa to be sent to her new pharmacy. Please advise.

## 2019-10-05 ENCOUNTER — Other Ambulatory Visit: Payer: Self-pay | Admitting: Family Medicine

## 2022-04-20 ENCOUNTER — Encounter: Payer: Self-pay | Admitting: *Deleted
